# Patient Record
Sex: Male | Born: 1955
Health system: Southern US, Community
[De-identification: ages and names within clinical notes are randomized; demographics above are authoritative.]

## PROBLEM LIST (undated history)

## (undated) DIAGNOSIS — A048 Other specified bacterial intestinal infections: Secondary | ICD-10-CM

## (undated) DIAGNOSIS — J309 Allergic rhinitis, unspecified: Secondary | ICD-10-CM

## (undated) DIAGNOSIS — E785 Hyperlipidemia, unspecified: Secondary | ICD-10-CM

## (undated) DIAGNOSIS — I341 Nonrheumatic mitral (valve) prolapse: Secondary | ICD-10-CM

## (undated) DIAGNOSIS — M7710 Lateral epicondylitis, unspecified elbow: Secondary | ICD-10-CM

## (undated) DIAGNOSIS — K219 Gastro-esophageal reflux disease without esophagitis: Secondary | ICD-10-CM

## (undated) DIAGNOSIS — R918 Other nonspecific abnormal finding of lung field: Secondary | ICD-10-CM

## (undated) DIAGNOSIS — G43909 Migraine, unspecified, not intractable, without status migrainosus: Secondary | ICD-10-CM

## (undated) DIAGNOSIS — I2510 Atherosclerotic heart disease of native coronary artery without angina pectoris: Secondary | ICD-10-CM

## (undated) DIAGNOSIS — Z8619 Personal history of other infectious and parasitic diseases: Secondary | ICD-10-CM

## (undated) DIAGNOSIS — M1990 Unspecified osteoarthritis, unspecified site: Secondary | ICD-10-CM

## (undated) DIAGNOSIS — S5400XA Injury of ulnar nerve at forearm level, unspecified arm, initial encounter: Secondary | ICD-10-CM

## (undated) DIAGNOSIS — K760 Fatty (change of) liver, not elsewhere classified: Secondary | ICD-10-CM

## (undated) DIAGNOSIS — N200 Calculus of kidney: Secondary | ICD-10-CM

## (undated) DIAGNOSIS — Z972 Presence of dental prosthetic device (complete) (partial): Secondary | ICD-10-CM

## (undated) DIAGNOSIS — C4491 Basal cell carcinoma of skin, unspecified: Secondary | ICD-10-CM

## (undated) HISTORY — DX: Other specified bacterial intestinal infections: A04.8

## (undated) HISTORY — DX: Hyperlipidemia, unspecified: E78.5

## (undated) HISTORY — DX: Other nonspecific abnormal finding of lung field: R91.8

## (undated) HISTORY — DX: Calculus of kidney: N20.0

## (undated) HISTORY — DX: Allergic rhinitis, unspecified: J30.9

## (undated) HISTORY — DX: Nonrheumatic mitral (valve) prolapse: I34.1

## (undated) HISTORY — DX: Basal cell carcinoma of skin, unspecified: C44.91

## (undated) HISTORY — DX: Gastro-esophageal reflux disease without esophagitis: K21.9

## (undated) HISTORY — DX: Fatty (change of) liver, not elsewhere classified: K76.0

## (undated) HISTORY — DX: Migraine, unspecified, not intractable, without status migrainosus: G43.909

## (undated) HISTORY — DX: Atherosclerotic heart disease of native coronary artery without angina pectoris: I25.10

## (undated) HISTORY — DX: Personal history of other infectious and parasitic diseases: Z86.19

---

## 1976-02-20 HISTORY — PX: DENTAL SURGERY: SHX609

## 1997-02-19 HISTORY — PX: ESOPHAGOGASTRODUODENOSCOPY: SHX1529

## 2000-09-17 ENCOUNTER — Encounter: Admission: RE | Admit: 2000-09-17 | Discharge: 2000-09-17 | Payer: Self-pay | Admitting: *Deleted

## 2000-09-17 ENCOUNTER — Encounter: Payer: Self-pay | Admitting: Allergy and Immunology

## 2003-02-20 HISTORY — PX: NASAL SINUS SURGERY: SHX719

## 2003-06-28 ENCOUNTER — Other Ambulatory Visit: Payer: Self-pay

## 2003-11-30 ENCOUNTER — Ambulatory Visit: Payer: Self-pay | Admitting: Otolaryngology

## 2005-08-29 ENCOUNTER — Encounter: Payer: Self-pay | Admitting: General Practice

## 2005-09-19 ENCOUNTER — Encounter: Payer: Self-pay | Admitting: General Practice

## 2005-10-20 ENCOUNTER — Encounter: Payer: Self-pay | Admitting: General Practice

## 2006-02-19 HISTORY — PX: COLONOSCOPY: SHX174

## 2006-02-19 LAB — HM COLONOSCOPY

## 2006-03-29 ENCOUNTER — Ambulatory Visit: Payer: Self-pay | Admitting: Gastroenterology

## 2006-07-05 ENCOUNTER — Emergency Department: Payer: Self-pay | Admitting: Emergency Medicine

## 2006-07-05 ENCOUNTER — Other Ambulatory Visit: Payer: Self-pay

## 2010-08-01 ENCOUNTER — Encounter: Payer: Self-pay | Admitting: General Practice

## 2010-08-20 ENCOUNTER — Encounter: Payer: Self-pay | Admitting: General Practice

## 2011-06-28 ENCOUNTER — Encounter: Payer: Self-pay | Admitting: General Practice

## 2011-07-21 ENCOUNTER — Encounter: Payer: Self-pay | Admitting: General Practice

## 2012-04-21 LAB — LIPID PANEL
CHOLESTEROL: 158 mg/dL (ref 0–200)
HDL: 33 mg/dL — AB (ref 35–70)
LDL CALC: 95.3
Triglycerides: 147

## 2012-04-21 LAB — COMPREHENSIVE METABOLIC PANEL
ALT: 31 U/L (ref 10–40)
AST: 22 U/L
Alkaline Phosphatase: 43 U/L
Creat: 1.3
Potassium: 4.6 mmol/L
SODIUM: 141 mmol/L (ref 137–147)

## 2012-04-21 LAB — PSA: PSA: 1.26

## 2012-04-21 LAB — CBC
HEMOGLOBIN: 15.3 g/dL
WBC: 4.9
platelet count: 218

## 2013-04-06 ENCOUNTER — Telehealth: Payer: Self-pay | Admitting: Family Medicine

## 2013-04-06 NOTE — Telephone Encounter (Signed)
Pt is trying to est w/you as his PCP.  Your first new patient apptmt is 06/22/2013, however, pt will need to be seen prior to the end of March, 2015 due to insurance purposes.  Can you accommodate him a new pt apptmt prior to the end of March? Thank you.

## 2013-04-07 NOTE — Telephone Encounter (Signed)
May place 5:30pm on march 12th if ok with patient.

## 2013-04-08 NOTE — Telephone Encounter (Signed)
Scheduled pt 04/30/2013 @ 5:30 p.m.

## 2013-04-30 ENCOUNTER — Encounter: Payer: Self-pay | Admitting: Family Medicine

## 2013-04-30 ENCOUNTER — Ambulatory Visit (INDEPENDENT_AMBULATORY_CARE_PROVIDER_SITE_OTHER): Payer: BC Managed Care – PPO | Admitting: Family Medicine

## 2013-04-30 VITALS — BP 126/78 | HR 68 | Temp 98.1°F | Wt 211.8 lb

## 2013-04-30 DIAGNOSIS — Z0000 Encounter for general adult medical examination without abnormal findings: Secondary | ICD-10-CM | POA: Insufficient documentation

## 2013-04-30 DIAGNOSIS — Z125 Encounter for screening for malignant neoplasm of prostate: Secondary | ICD-10-CM

## 2013-04-30 DIAGNOSIS — K219 Gastro-esophageal reflux disease without esophagitis: Secondary | ICD-10-CM | POA: Insufficient documentation

## 2013-04-30 DIAGNOSIS — E785 Hyperlipidemia, unspecified: Secondary | ICD-10-CM

## 2013-04-30 DIAGNOSIS — G43909 Migraine, unspecified, not intractable, without status migrainosus: Secondary | ICD-10-CM

## 2013-04-30 DIAGNOSIS — J309 Allergic rhinitis, unspecified: Secondary | ICD-10-CM | POA: Insufficient documentation

## 2013-04-30 MED ORDER — ATORVASTATIN CALCIUM 10 MG PO TABS
10.0000 mg | ORAL_TABLET | Freq: Every day | ORAL | Status: DC
Start: 2013-04-30 — End: 2014-04-29

## 2013-04-30 MED ORDER — FENOFIBRATE MICRONIZED 134 MG PO CAPS: 134.0000 mg | ORAL_CAPSULE | Freq: Every day | ORAL | Status: DC

## 2013-04-30 MED ORDER — OMEPRAZOLE 20 MG PO CPDR: 20.0000 mg | DELAYED_RELEASE_CAPSULE | Freq: Every day | ORAL | Status: DC

## 2013-04-30 MED ORDER — OMEGA-3-ACID ETHYL ESTERS 1 G PO CAPS: 2.0000 | ORAL_CAPSULE | Freq: Every day | ORAL | Status: DC

## 2013-04-30 NOTE — Assessment & Plan Note (Signed)
Continue PPI ?

## 2013-04-30 NOTE — Patient Instructions (Signed)
Good to meet you today  Physical today. Blood work today Call us with questions.  Return in 1 year or sooner as needed

## 2013-04-30 NOTE — Assessment & Plan Note (Signed)
Improved with sinus surgery

## 2013-04-30 NOTE — Assessment & Plan Note (Signed)
Chronic, check FLP today.

## 2013-04-30 NOTE — Assessment & Plan Note (Addendum)
Preventative protocols reviewed and updated unless pt declined. Discussed healthy diet and lifestyle. Desires PSA/DRE

## 2013-04-30 NOTE — Progress Notes (Signed)
BP 126/78  Pulse 68  Temp(Src) 98.1 F (36.7 C) (Oral)  Wt 211 lb 12.8 oz (96.072 kg)   CC: new pt to establish  Subjective:    Patient ID: Ethan Austin, male    DOB: 1955-11-29, 58 y.o.   MRN: [id number]  HPI: Ethan Austin is a 58 y.o. male presenting on 04/30/2013 with Establish Care   Prior saw Dr. Verneda Skill, bad experience with refilling meds.  HLD - takes lovaza and then lipitor [place] (alternating days).  No myalgias.  Last FLP was 1 year ago.  Currently receiving treatment for sinusitis by Dr. Nadeen Landau - on levofloxacin.  Tends to get 1 per year.  S/p sinus surgery.  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  No sunburns.  + h/o BCC  Preventative: 02/2011  Colonoscopy 2008 WNL per patient, rec rpt 10 years. Flu - at work Tdap - 2014  Relevant past medical, surgical, family and social history reviewed and updated as indicated.  Allergies and medications reviewed and updated. No current outpatient prescriptions on file prior to visit.   No current facility-administered medications on file prior to visit.    Review of Systems  Constitutional: Negative for fever, chills, activity change, appetite change, fatigue and unexpected weight change.  HENT: Negative for hearing loss.   Eyes: Negative for visual disturbance.  Respiratory: Negative for cough, chest tightness, shortness of breath and wheezing.   Cardiovascular: Negative for chest pain, palpitations and leg swelling.  Gastrointestinal: Negative for nausea, vomiting, abdominal pain, diarrhea, constipation, blood in stool and abdominal distention.  Genitourinary: Negative for hematuria and difficulty urinating.  Musculoskeletal: Negative for arthralgias, myalgias and neck  pain.  Skin: Negative for rash.  Neurological: Negative for dizziness, seizures, syncope and headaches.  Hematological: Negative for adenopathy. Does not bruise/bleed easily.  Psychiatric/Behavioral: Negative for dysphoric mood. The patient is not nervous/anxious.    Per HPI unless specifically indicated above    Objective:    BP 126/78  Pulse 68  Temp(Src) 98.1 F (36.7 C) (Oral)  Wt 211 lb 12.8 oz (96.072 kg)  Physical Exam  Nursing note and vitals reviewed. Constitutional: He is oriented to person, place, and time. He appears well-developed and well-nourished. No distress.  HENT:  Head: Normocephalic and atraumatic.  Right Ear: Hearing, tympanic membrane, external ear and ear canal normal.  Left Ear: Hearing, tympanic membrane, external ear and ear canal normal.  Nose: Nose normal.  Mouth/Throat: Uvula is midline, oropharynx is clear and moist and mucous membranes are normal. No oropharyngeal exudate, posterior oropharyngeal edema or posterior oropharyngeal erythema.  Eyes: Conjunctivae and EOM are normal. Pupils are equal, round, and reactive to light. No scleral icterus.  Neck: Normal range of motion. Neck supple.  Cardiovascular: Normal rate, regular rhythm and intact distal pulses.   Murmur (3/6 SEM best at apex) heard. Pulses:      Radial pulses are 2+ on the right side, and 2+ on the left side.  Pulmonary/Chest: Effort normal and breath sounds normal. No respiratory distress. He has no wheezes. He has no rales.  Abdominal: Soft. Bowel sounds are normal. He exhibits no distension and no mass. There is no tenderness. There is no rebound and no guarding.  Genitourinary: Rectum normal and prostate normal. Rectal exam shows no  external hemorrhoid, no internal hemorrhoid, no fissure, no mass, no tenderness and anal tone normal. Prostate is not enlarged (10gm) and not tender.  Musculoskeletal: Normal range of motion. He exhibits no edema.  Lymphadenopathy:    He has no cervical  adenopathy.  Neurological: He is alert and oriented to person, place, and time.  CN grossly intact, station and gait intact  Skin: Skin is warm and dry. No rash noted.  Psychiatric: He has a normal mood and affect. His behavior is normal. Judgment and thought content normal.   No results found for this or any previous visit.    Assessment & Plan:   Problem List Items Addressed This Visit   Allergic rhinitis   GERD (gastroesophageal reflux disease)     Continue PPI.    Relevant Medications      omeprazole (PRILOSEC) 20 MG capsule   Health care maintenance - Primary     Preventative protocols reviewed and updated unless pt declined. Discussed healthy diet and lifestyle. Desires PSA/DRE    HLD (hyperlipidemia)     Chronic, check FLP today.    Relevant Medications      fenofibrate micronized (LOFIBRA) 134 MG capsule      atorvastatin (LIPITOR) 10 MG tablet      aspirin 81 MG tablet      omega-3 acid ethyl esters (LOVAZA) 1 G capsule   Other Relevant Orders      Lipid panel      Comprehensive metabolic panel      TSH   RESOLVED: Migraine     Improved with sinus surgery     Other Visit Diagnoses   Special screening for malignant neoplasm of prostate        Relevant Orders       PSA        Follow up plan: No Follow-up on file.

## 2013-05-01 LAB — COMPREHENSIVE METABOLIC PANEL
ALT: 31 U/L (ref 0–53)
AST: 24 U/L (ref 0–37)
Albumin: 4.6 g/dL (ref 3.5–5.2)
Alkaline Phosphatase: 47 U/L (ref 39–117)
BUN: 21 mg/dL (ref 6–23)
CO2: 27 mEq/L (ref 19–32)
Calcium: 9.8 mg/dL (ref 8.4–10.5)
Chloride: 105 mEq/L (ref 96–112)
Creatinine, Ser: 1.3 mg/dL (ref 0.4–1.5)
GFR: 61.15 mL/min (ref >60.00)
Glucose, Bld: 91 mg/dL (ref 70–99)
Potassium: 4.1 mEq/L (ref 3.5–5.1)
Sodium: 140 mEq/L (ref 135–145)
Total Bilirubin: 1.1 mg/dL (ref 0.3–1.2)
Total Protein: 7.1 g/dL (ref 6.0–8.3)

## 2013-05-01 LAB — PSA: PSA: 0.91 ng/mL (ref 0.10–4.00)

## 2013-05-01 LAB — LIPID PANEL
Cholesterol: 185 mg/dL (ref 0–200)
HDL: 35.5 mg/dL — ABNORMAL LOW (ref >39.00)
LDL Cholesterol: 117 mg/dL — ABNORMAL HIGH (ref 0–99)
Total CHOL/HDL Ratio: 5
Triglycerides: 161 mg/dL — ABNORMAL HIGH (ref 0.0–149.0)
VLDL: 32.2 mg/dL (ref 0.0–40.0)

## 2013-05-01 LAB — TSH: TSH: 0.57 u[IU]/mL (ref 0.35–5.50)

## 2013-05-04 ENCOUNTER — Telehealth: Payer: Self-pay

## 2013-05-04 DIAGNOSIS — Z0000 Encounter for general adult medical examination without abnormal findings: Secondary | ICD-10-CM

## 2013-05-04 NOTE — Telephone Encounter (Signed)
Pt left v/m; pt recently had lab test and office visit;  pt needs nicotine lab testing for tobacco substance use that is required by pts employer and also has a form to be completed after receives the nicotine results.pt request cb.

## 2013-05-04 NOTE — Telephone Encounter (Signed)
Patient notified and lab appt scheduled.  

## 2013-05-04 NOTE — Telephone Encounter (Signed)
Test ordered.  plz have him come in for rpt blood test.  May drop off form at that time.  Thanks.

## 2013-05-06 ENCOUNTER — Telehealth: Payer: Self-pay | Admitting: Family Medicine

## 2013-05-06 ENCOUNTER — Encounter: Payer: Self-pay | Admitting: Family Medicine

## 2013-05-06 ENCOUNTER — Other Ambulatory Visit (INDEPENDENT_AMBULATORY_CARE_PROVIDER_SITE_OTHER): Payer: BC Managed Care – PPO

## 2013-05-06 DIAGNOSIS — Z0000 Encounter for general adult medical examination without abnormal findings: Secondary | ICD-10-CM

## 2013-05-06 NOTE — Telephone Encounter (Signed)
Pt dropped off work wellness form to be filled out. Form given to [employer]. Thank you

## 2013-05-07 NOTE — Telephone Encounter (Signed)
Form in your IN box for completion. Patient would like to come pick up a copy when completed. Please return to me.

## 2013-05-07 NOTE — Telephone Encounter (Signed)
Signed and placed in [name]'s box. Awaiting nicotine test. plz remember to do heights at new pt appts and at annual exams.

## 2013-05-08 LAB — NICOTINE/COTININE METABOLITES

## 2013-05-25 NOTE — Telephone Encounter (Addendum)
Spoke to patient and was advised that he has already picked the completed form up. Someone had called the patient, but there was not any documentation. See note about heights.

## 2013-06-08 ENCOUNTER — Encounter: Payer: Self-pay | Admitting: *Deleted

## 2014-01-04 ENCOUNTER — Encounter: Payer: Self-pay | Admitting: Family Medicine

## 2014-01-04 ENCOUNTER — Ambulatory Visit (INDEPENDENT_AMBULATORY_CARE_PROVIDER_SITE_OTHER): Payer: BC Managed Care – PPO | Admitting: Family Medicine

## 2014-01-04 VITALS — BP 128/78 | HR 72 | Temp 98.2°F | Wt 215.5 lb

## 2014-01-04 DIAGNOSIS — M5432 Sciatica, left side: Secondary | ICD-10-CM

## 2014-01-04 DIAGNOSIS — M5430 Sciatica, unspecified side: Secondary | ICD-10-CM | POA: Insufficient documentation

## 2014-01-04 DIAGNOSIS — M7062 Trochanteric bursitis, left hip: Secondary | ICD-10-CM

## 2014-01-04 DIAGNOSIS — M7060 Trochanteric bursitis, unspecified hip: Secondary | ICD-10-CM | POA: Insufficient documentation

## 2014-01-04 MED ORDER — NAPROXEN 500 MG PO TABS: ORAL_TABLET | ORAL | Status: DC

## 2014-01-04 MED ORDER — TRAMADOL HCL 50 MG PO TABS: 50.0000 mg | ORAL_TABLET | Freq: Two times a day (BID) | ORAL | Status: DC | PRN

## 2014-01-04 NOTE — Progress Notes (Signed)
   BP 128/78 mmHg  Pulse 72  Temp(Src) 98.2 F (36.8 C) (Oral)  Wt 215 lb 8 oz (97.75 kg)   CC: hip pain  Subjective:    Patient ID: Ethan Austin, male    DOB: November 27, 1955, 58 y.o.   MRN: [id number]  HPI: Ethan Austin is a 58 y.o. male presenting on 01/04/2014 for Hip Pain   5d h/o L hip pain. Denies inciting trauma/injury. Did some cleaning at church last week (mopping floor). Thursday started with nagging L buttock pain that has progressively worsened over weekend. Sitting and tying shoe worsens pain. No radiation of pain, stays at hip and buttock. Ibuprofen 800mg  and tylenol haven't really helped.   No h/o hip injury or issues in the past.  Relevant past medical, surgical, family and social history reviewed and updated as indicated.  Allergies and medications reviewed and updated. Current Outpatient Prescriptions on File Prior to Visit  Medication Sig  . aspirin 81 MG tablet Take 81 mg by mouth daily.  Marland Kitchen atorvastatin (LIPITOR) 10 MG tablet Take 1 tablet (10 mg total) by mouth daily at 6 PM. (Patient taking differently: Take 10 mg by mouth every other day. Alternates with fenofibrate)  . fenofibrate micronized (LOFIBRA) 134 MG capsule Take 1 capsule (134 mg total) by mouth daily before breakfast. (Patient taking differently: Take 134 mg by mouth every other day. Alternates with atorvastatin)  . omega-3 acid ethyl esters (LOVAZA) 1 G capsule Take 2 capsules (2 g total) by mouth daily.  Marland Kitchen omeprazole (PRILOSEC) 20 MG capsule Take 1 capsule (20 mg total) by mouth daily.   No current facility-administered medications on file prior to visit.    Review of Systems Per HPI unless specifically indicated above    Objective:    BP 128/78 mmHg  Pulse 72  Temp(Src) 98.2 F (36.8 C) (Oral)  Wt 215 lb 8 oz (97.75 kg)  Physical Exam  Constitutional: He is oriented to person, place, and time. He appears well-developed and well-nourished. No distress.  Prefers to stand    Musculoskeletal: He exhibits no edema.  No pain midline spine No paraspinous mm tenderness Neg SLR bilaterally. No pain with int/ext rotation at hip. Tender L hip to FABER. No pain at SIJ Tender at L GTB and superior to sciatic notch  Neurological: He is alert and oriented to person, place, and time. He has normal strength. No sensory deficit.  5/5 strength BLE  Nursing note and vitals reviewed.      Assessment & Plan:   Problem List Items Addressed This Visit    Trochanteric bursitis of left hip - Primary    Exam consistent with L GTBursitis. Treat with naprosyn, tramadol for breakthrough pain, exercises from SM pt advisor. Continue heating pad. Offered PT if not improving. Consider steroid injection as well.    Sciatica of left side    Also anticipate component of L sciatica  Treat with naprosyn, tramadol for breakthrough pain, exercises from SM pt advisor. Continue heating pad. Offered PT if not improving.         Follow up plan: Return if symptoms worsen or fail to improve.

## 2014-01-04 NOTE — Assessment & Plan Note (Signed)
Exam consistent with L GTBursitis. Treat with naprosyn, tramadol for breakthrough pain, exercises from SM pt advisor. Continue heating pad. Offered PT if not improving. Consider steroid injection as well.

## 2014-01-04 NOTE — Assessment & Plan Note (Signed)
Also anticipate component of L sciatica  Treat with naprosyn, tramadol for breakthrough pain, exercises from SM pt advisor. Continue heating pad. Offered PT if not improving.

## 2014-01-04 NOTE — Progress Notes (Signed)
Pre visit review using our clinic review tool, if applicable. No additional management support is needed unless otherwise documented below in the visit note. 

## 2014-01-04 NOTE — Patient Instructions (Signed)
I think you have left hip bursitis as well as sciatica. Treat with naprosyn twice daily with food for 5 days then as needed, may use tramadol for breakthrough pain. Do exercises provided today. Continue heating pad. If GERD flaring up on naprosyn, may double up on omeprazole for short course. If not better with above, let me know for PT referral.

## 2014-01-20 ENCOUNTER — Encounter: Payer: Self-pay | Admitting: Family Medicine

## 2014-01-20 NOTE — Telephone Encounter (Signed)
See above. plz call pt to schedule appt with me or other available provider if pt desires.

## 2014-01-20 NOTE — Telephone Encounter (Signed)
Message left advising patient to call and schedule appt. Advised none available with you tomorrow, but he could see another provider if he wanted or he could wait until Friday.

## 2014-01-20 NOTE — Telephone Encounter (Signed)
May schedule Friday at 4:30pm.

## 2014-01-22 ENCOUNTER — Encounter: Payer: Self-pay | Admitting: Family Medicine

## 2014-01-22 ENCOUNTER — Ambulatory Visit (INDEPENDENT_AMBULATORY_CARE_PROVIDER_SITE_OTHER): Payer: BC Managed Care – PPO | Admitting: Family Medicine

## 2014-01-22 VITALS — BP 132/70 | HR 68 | Temp 98.2°F | Wt 213.0 lb

## 2014-01-22 DIAGNOSIS — R0982 Postnasal drip: Secondary | ICD-10-CM | POA: Insufficient documentation

## 2014-01-22 MED ORDER — FLUTICASONE PROPIONATE 50 MCG/ACT NA SUSP: 2.0000 | Freq: Every day | NASAL | Status: DC

## 2014-01-22 NOTE — Progress Notes (Signed)
Pre visit review using our clinic review tool, if applicable. No additional management support is needed unless otherwise documented below in the visit note. 

## 2014-01-22 NOTE — Assessment & Plan Note (Signed)
No evidence of infectious cause today. Treat with flonase, start antihistamine. Update if not improving as expected or any new sxs like fever or worsening cough.

## 2014-01-22 NOTE — Patient Instructions (Signed)
I think this is allergy related drainage. I'd recommend restarting claritin or zyrtec daily, and restarting flonase nasal steroid. Watch for fever >101 or worsening cough.

## 2014-01-22 NOTE — Progress Notes (Signed)
BP 132/70 mmHg  Pulse 68  Temp(Src) 98.2 F (36.8 C) (Oral)  Wt 213 lb (96.616 kg)   CC: throat drainage/ST  Subjective:    Patient ID: Ethan Austin, male    DOB: November 26, 1955, 58 y.o.   MRN: [id number]  HPI: LIDO MASKE is a 58 y.o. male presenting on 01/22/2014 for Sore Throat   Significant amt throat drainage over last 5 days along with sneezing and cough from drainage. Mild nausea from drainage.   No pressure headache, sinus congestion, fevers, ear pain.  Using nasal saline flush to treat, neti pot. No sick contacts at home. Known allergic rhinitis  Drinking water. Coffee seems to clear sxs.  Relevant past medical, surgical, family and social history reviewed and updated as indicated. Interim medical history since our last visit reviewed. Allergies and medications reviewed and updated.  Current Outpatient Prescriptions on File Prior to Visit  Medication Sig  . aspirin 81 MG tablet Take 81 mg by mouth daily.  Marland Kitchen atorvastatin (LIPITOR) 10 MG tablet Take 1 tablet (10 mg total) by mouth daily at 6 PM. (Patient taking differently: Take 10 mg by mouth every other day. Alternates with fenofibrate)  . fenofibrate micronized (LOFIBRA) 134 MG capsule Take 1 capsule (134 mg total) by mouth daily before breakfast. (Patient taking differently: Take 134 mg by mouth every other day. Alternates with atorvastatin)  . naproxen (NAPROSYN) 500 MG tablet Take one po bid x 5 days then prn pain, take with food  . omega-3 acid ethyl esters (LOVAZA) 1 G capsule Take 2 capsules (2 g total) by mouth daily.  Marland Kitchen omeprazole (PRILOSEC) 20 MG capsule Take 1 capsule (20 mg total) by mouth daily.  . traMADol (ULTRAM) 50 MG tablet Take 1 tablet (50 mg total) by mouth 2 (two) times daily as needed.   No current facility-administered medications on file prior to visit.    Review of Systems Per HPI unless specifically indicated above     Objective:    BP 132/70 mmHg  Pulse 68  Temp(Src) 98.2 F  (36.8 C) (Oral)  Wt 213 lb (96.616 kg)  Wt Readings from Last 3 Encounters:  01/22/14 213 lb (96.616 kg)  01/04/14 215 lb 8 oz (97.75 kg)  04/30/13 211 lb 12.8 oz (96.072 kg)    Physical Exam  Constitutional: He appears well-developed and well-nourished. No distress.  HENT:  Head: Normocephalic and atraumatic.  Right Ear: Hearing, tympanic membrane, external ear and ear canal normal.  Left Ear: Hearing, tympanic membrane, external ear and ear canal normal.  Nose: Mucosal edema (nasal mucosal congestion) present. No rhinorrhea. Right sinus exhibits no maxillary sinus tenderness and no frontal sinus tenderness. Left sinus exhibits no maxillary sinus tenderness and no frontal sinus tenderness.  Mouth/Throat: Uvula is midline, oropharynx is clear and moist and mucous membranes are normal. No oropharyngeal exudate, posterior oropharyngeal edema, posterior oropharyngeal erythema or tonsillar abscesses.  No significant drainage noted today.  Eyes: Conjunctivae and EOM are normal. Pupils are equal, round, and reactive to light. No scleral icterus.  Neck: Normal range of motion. Neck supple.  Pulmonary/Chest: Effort normal and breath sounds normal. No respiratory distress. He has no wheezes. He has no rales.  Lymphadenopathy:    He has no cervical adenopathy.  Skin: Skin is warm and dry. No rash noted.  Nursing note and vitals reviewed.  Results for orders placed or performed in visit on 06/08/13  CBC  Result Value Ref Range   WBC 4.9  HGB 15.3 g/dL   platelet count 218   PSA  Result Value Ref Range   PSA 1.26   Comprehensive metabolic panel  Result Value Ref Range   Alkaline Phosphatase 43 U/L   Creat 1.3    Potassium 4.6 mmol/L   AST 22 U/L   ALT 31 10 - 40 U/L   Sodium 141 137 - 147 mmol/L  Lipid panel  Result Value Ref Range   Cholesterol 158 0 - 200 mg/dL   Triglycerides 147    HDL 33 (A) 35 - 70 mg/dL   LDL (calc) 95.3       Assessment & Plan:   Problem List Items  Addressed This Visit    Post-nasal drip - Primary    No evidence of infectious cause today. Treat with flonase, start antihistamine. Update if not improving as expected or any new sxs like fever or worsening cough.        Follow up plan: Return if symptoms worsen or fail to improve.

## 2014-04-08 ENCOUNTER — Other Ambulatory Visit: Payer: Self-pay | Admitting: Family Medicine

## 2014-04-09 MED ORDER — TRAMADOL HCL 50 MG PO TABS: 50.0000 mg | ORAL_TABLET | Freq: Two times a day (BID) | ORAL | Status: DC | PRN

## 2014-04-09 MED ORDER — NAPROXEN 500 MG PO TABS: ORAL_TABLET | ORAL | Status: DC

## 2014-04-09 NOTE — Telephone Encounter (Signed)
Rx called in as directed.   

## 2014-04-09 NOTE — Telephone Encounter (Signed)
plz phone in. 

## 2014-04-12 ENCOUNTER — Ambulatory Visit (INDEPENDENT_AMBULATORY_CARE_PROVIDER_SITE_OTHER): Payer: BLUE CROSS/BLUE SHIELD | Admitting: Internal Medicine

## 2014-04-12 ENCOUNTER — Encounter: Payer: Self-pay | Admitting: Internal Medicine

## 2014-04-12 VITALS — BP 122/80 | HR 83 | Temp 99.7°F | Wt 215.0 lb

## 2014-04-12 DIAGNOSIS — J0100 Acute maxillary sinusitis, unspecified: Secondary | ICD-10-CM

## 2014-04-12 MED ORDER — CEFUROXIME AXETIL 500 MG PO TABS: 500.0000 mg | ORAL_TABLET | Freq: Two times a day (BID) | ORAL | Status: DC

## 2014-04-12 NOTE — Progress Notes (Signed)
Pre visit review using our clinic review tool, if applicable. No additional management support is needed unless otherwise documented below in the visit note. 

## 2014-04-12 NOTE — Progress Notes (Signed)
HPI  Pt presents to the clinic today with c/o headache, facial pain and pressure, post nasal drip and sore throat. He reports this started 2 weeks ago. He is not blowing any mucous out of his nose. He has run fevers up to 100. He has a non productive cough. He has had associated chills and body aches. He does have a history of allergies and sinus infections. He reports he has had a sinusoplasty by ENT. He has tried Mucinex and saline spray without much relief. He also takes zyrtec and flonase daily. He has not had sick contacts that he is aware of. He does not smoke.  Review of Systems    Past Medical History  Diagnosis Date  . Allergic rhinitis   . History of chicken pox   . GERD (gastroesophageal reflux disease)   . Migraine <2005    resolved after sinus surgery, now occasional TTH  . H. pylori infection   . HLD (hyperlipidemia)   . MVP (mitral valve prolapse)     history  . BCC (basal cell carcinoma of skin)     on face and nose    Family History  Problem Relation Age of Onset  . Alzheimer's disease Mother   . Cancer Sister     breast  . Diabetes Maternal Grandfather   . Diabetes Maternal Grandmother   . Stroke Mother   . CAD Father     mild MI  . Hypertension Mother     History   Social History  . Marital Status: Married    Spouse Name: N/A  . Number of Children: N/A  . Years of Education: N/A   Occupational History  . Not on file.   Social History Main Topics  . Smoking status: Never Smoker   . Smokeless tobacco: Never Used  . Alcohol Use: No  . Drug Use: No  . Sexual Activity: Not on file   Other Topics Concern  . 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 Sulfur Nausea Only      Constitutional: Positive headache, fatigue and fever. Denies abrupt weight changes.  HEENT:  Positive facial pain, nasal congestion and sore throat. Denies eye redness, ear pain, ringing in the ears, wax buildup, runny nose or bloody nose. Respiratory: Positive cough. Denies difficulty breathing or shortness of breath.  Cardiovascular: Denies chest pain, chest tightness, palpitations or swelling in the hands or feet.   No other specific complaints in a complete review of systems (except as listed in HPI above).  Objective:  BP 122/80 mmHg  Pulse 83  Temp(Src) 99.7 F (37.6 C) (Oral)  Wt 215 lb (97.523 kg)  SpO2 98%   General: Appears his stated age, ill appearing in NAD. HEENT: Head: normal shape and size, maxillary sinus tenderness noted; Eyes: sclera white, no icterus, conjunctiva pink; Ears: Tm's gray and intact, normal light reflex, + serous effusion bilaterally; Nose: mucosa pink and moist, septum midline; Throat/Mouth: + PND. Teeth present, mucosa pink and moist, no exudate noted, no lesions or ulcerations noted.  Neck: No adenopathy noted. Cardiovascular: Normal rate and rhythm. S1,S2 noted.  No murmur, rubs or gallops noted.  Pulmonary/Chest: Normal effort and  positive vesicular breath sounds. No respiratory distress. No wheezes, rales or ronchi noted.      Assessment & Plan:   Acute maxillary sinusitis  Can use a Neti Pot which can be purchased from your local drug store. Continue zyrtec and Flonase as directed Ceftin BID for 10 days Tylenol or Ibuprofen for fever  RTC as needed or if symptoms persist.

## 2014-04-12 NOTE — Patient Instructions (Signed)

## 2014-04-15 ENCOUNTER — Encounter: Payer: Self-pay | Admitting: Internal Medicine

## 2014-04-29 ENCOUNTER — Ambulatory Visit (INDEPENDENT_AMBULATORY_CARE_PROVIDER_SITE_OTHER): Payer: BLUE CROSS/BLUE SHIELD | Admitting: Family Medicine

## 2014-04-29 ENCOUNTER — Encounter: Payer: Self-pay | Admitting: Family Medicine

## 2014-04-29 VITALS — BP 106/62 | HR 62 | Temp 97.9°F | Ht 75.0 in | Wt 208.5 lb

## 2014-04-29 DIAGNOSIS — Z125 Encounter for screening for malignant neoplasm of prostate: Secondary | ICD-10-CM

## 2014-04-29 DIAGNOSIS — E785 Hyperlipidemia, unspecified: Secondary | ICD-10-CM

## 2014-04-29 DIAGNOSIS — Z0000 Encounter for general adult medical examination without abnormal findings: Secondary | ICD-10-CM | POA: Diagnosis not present

## 2014-04-29 DIAGNOSIS — J329 Chronic sinusitis, unspecified: Secondary | ICD-10-CM | POA: Insufficient documentation

## 2014-04-29 MED ORDER — OMEGA-3-ACID ETHYL ESTERS 1 G PO CAPS: 4.0000 | ORAL_CAPSULE | Freq: Every day | ORAL | Status: DC

## 2014-04-29 MED ORDER — FENOFIBRATE MICRONIZED 134 MG PO CAPS: 134.0000 mg | ORAL_CAPSULE | Freq: Every day | ORAL | Status: DC

## 2014-04-29 MED ORDER — OMEPRAZOLE 20 MG PO CPDR: 20.0000 mg | DELAYED_RELEASE_CAPSULE | Freq: Every day | ORAL | Status: DC

## 2014-04-29 MED ORDER — ATORVASTATIN CALCIUM 10 MG PO TABS: 10.0000 mg | ORAL_TABLET | Freq: Every day | ORAL | Status: DC

## 2014-04-29 NOTE — Assessment & Plan Note (Signed)
meds refilled today. Check FLP today.

## 2014-04-29 NOTE — Progress Notes (Signed)
Pre visit review using our clinic review tool, if applicable. No additional management support is needed unless otherwise documented below in the visit note. 

## 2014-04-29 NOTE — Assessment & Plan Note (Addendum)
Preventative protocols reviewed and updated unless pt declined. Discussed healthy diet and lifestyle.  Needs cotinine levels checked today.

## 2014-04-29 NOTE — Patient Instructions (Addendum)
Blood work today. We will fill out form for work and fax in when labs return. Sign release form up front for colonoscopy report. Return as needed or in 1 year for next physical.

## 2014-04-29 NOTE — Assessment & Plan Note (Addendum)
Followed by ENT Dr Carlis Abbott.

## 2014-04-29 NOTE — Progress Notes (Signed)
BP 106/62 mmHg  Pulse 62  Temp(Src) 97.9 F (36.6 C) (Oral)  Ht 6\' 3"  (1.905 m)  Wt 208 lb 8 oz (94.575 kg)  BMI 26.06 kg/m2  SpO2 97%   CC: CPE  Subjective:    Patient ID: Ethan Austin, male    DOB: [birth year], 59 y.o.   MRN: [id number]  HPI: Ethan Austin is a 59 y.o. male presenting on 04/29/2014 for Annual Exam   Recurrent sinus infection last month s/p 3 rounds of abx. Had CT scan last week.   HLD - alternates fenofibrate with lipitor every other day. Takes 2gm lovaza daily.  Off lipitor for the past week 2/2 using fluconazole for thrush after abx. Throat feeling better but still looks bad.   Seat belt use discussed Sunscreen use discussed, hat use. No sunburns. + h/o BCC.   Preventative: Fasting since noon. Colonoscopy 2008 WNL per patient, rec rpt 10 years.  Prostate cancer screening - discussed, would like screening.  Flu - 11/2013 Tdap - 2014  Caffeine: 2 cups coffee in am, 1 tea at night Lives with wife and 2 children Occupation: Armed forces logistics/support/administrative officer at [employer]: some college Activity: [employer] with wife Diet: good water, fruits/vegetables daily  Relevant past medical, surgical, family and social history reviewed and updated as indicated. Interim medical history since our last visit reviewed. Allergies and medications reviewed and updated. Current Outpatient Prescriptions on File Prior to Visit  Medication Sig  . aspirin 81 MG tablet Take 81 mg by mouth daily.  . fluticasone (FLONASE) 50 MCG/ACT nasal spray Place 2 sprays into both nostrils daily.  . naproxen (NAPROSYN) 500 MG tablet Take one po bid x 5 days then prn pain, take with food (Patient not taking: Reported on 04/29/2014)  . traMADol (ULTRAM) 50 MG tablet Take 1 tablet (50 mg total) by mouth 2 (two) times daily as needed. (Patient not taking: Reported on 04/29/2014)   No current facility-administered medications on file prior to visit.    Review of Systems  Constitutional:  Negative for fever, chills, activity change, appetite change, fatigue and unexpected weight change.  HENT: Positive for congestion and sinus pressure. Negative for hearing loss.   Eyes: Negative for visual disturbance.  Respiratory: Positive for cough. Negative for chest tightness, shortness of breath and wheezing.   Cardiovascular: Negative for chest pain, palpitations and leg swelling.  Gastrointestinal: Positive for nausea (from drainage). Negative for vomiting, abdominal pain, diarrhea, constipation, blood in stool and abdominal distention.  Genitourinary: Negative for hematuria and difficulty urinating.  Musculoskeletal: Negative for myalgias, arthralgias and neck pain.  Skin: Negative for rash.  Neurological: Negative for dizziness, seizures, syncope and headaches.  Hematological: Negative for adenopathy. Does not bruise/bleed easily.  Psychiatric/Behavioral: Negative for dysphoric mood. The patient is not nervous/anxious.    Per HPI unless specifically indicated above     Objective:    BP 106/62 mmHg  Pulse 62  Temp(Src) 97.9 F (36.6 C) (Oral)  Ht 6\' 3"  (1.905 m)  Wt 208 lb 8 oz (94.575 kg)  BMI 26.06 kg/m2  SpO2 97%  Wt Readings from Last 3 Encounters:  04/29/14 208 lb 8 oz (94.575 kg)  04/12/14 215 lb (97.523 kg)  01/22/14 213 lb (96.616 kg)    Physical Exam  Constitutional: He is oriented to person, place, and time. He appears well-developed and well-nourished. No distress.  HENT:  Head: Normocephalic and atraumatic.  Right Ear: Hearing, tympanic membrane, external ear and ear canal normal.  Left Ear: Hearing, tympanic membrane, external ear and ear canal normal.  Nose: Nose normal.  Mouth/Throat: Uvula is midline, oropharynx is clear and moist and mucous membranes are normal. No oropharyngeal exudate, posterior oropharyngeal edema or posterior oropharyngeal erythema.  Eyes: Conjunctivae and EOM are normal. Pupils are equal, round, and reactive to light. No scleral  icterus.  Neck: Normal range of motion. Neck supple. Carotid bruit is not present. No thyromegaly present.  Cardiovascular: Normal rate, regular rhythm and intact distal pulses.   Murmur (3/6 SEM best at apex) heard. Pulses:      Radial pulses are 2+ on the right side, and 2+ on the left side.  Pulmonary/Chest: Effort normal and breath sounds normal. No respiratory distress. He has no wheezes. He has no rales.  Abdominal: Soft. Bowel sounds are normal. He exhibits no distension and no mass. There is no tenderness. There is no rebound and no guarding.  Genitourinary: Rectum normal and prostate normal. Rectal exam shows no external hemorrhoid, no internal hemorrhoid, no fissure, no mass, no tenderness and anal tone normal. Prostate is not enlarged (20gm) and not tender.  Musculoskeletal: Normal range of motion. He exhibits no edema.  Lymphadenopathy:    He has no cervical adenopathy.  Neurological: He is alert and oriented to person, place, and time.  CN grossly intact, station and gait intact  Skin: Skin is warm and dry. No rash noted.  Psychiatric: He has a normal mood and affect. His behavior is normal. Judgment and thought content normal.  Nursing note and vitals reviewed.      Assessment & Plan:   Problem List Items Addressed This Visit    Sinusitis, chronic    Followed by ENT Dr Carlis Abbott.      HLD (hyperlipidemia)    meds refilled today. Check FLP today.      Relevant Medications   atorvastatin (LIPITOR) tablet   fenofibrate micronized (LOFIBRA) capsule   omega-3 acid ethyl esters (LOVAZA) 1 G capsule   Other Relevant Orders   Lipid panel   Comprehensive metabolic panel   Health care maintenance - Primary    Preventative protocols reviewed and updated unless pt declined. Discussed healthy diet and lifestyle.  Needs cotinine levels checked today.      Relevant Orders   Nicotine/cotinine metabolites    Other Visit Diagnoses    Special screening for malignant neoplasm of  prostate        Relevant Orders    PSA        Follow up plan: Return in about 1 year (around 04/29/2015), or as needed, for annual exam, prior fasting for blood work.

## 2014-04-30 ENCOUNTER — Encounter: Payer: Self-pay | Admitting: Family Medicine

## 2014-04-30 LAB — COMPREHENSIVE METABOLIC PANEL
ALT: 29 U/L (ref 0–53)
AST: 21 U/L (ref 0–37)
Albumin: 4.7 g/dL (ref 3.5–5.2)
Alkaline Phosphatase: 49 U/L (ref 39–117)
BILIRUBIN TOTAL: 1 mg/dL (ref 0.2–1.2)
BUN: 20 mg/dL (ref 6–23)
CO2: 27 meq/L (ref 19–32)
Calcium: 9.8 mg/dL (ref 8.4–10.5)
Chloride: 104 mEq/L (ref 96–112)
Creatinine, Ser: 1.25 mg/dL (ref 0.40–1.50)
GFR: 62.63 mL/min (ref >60.00)
GLUCOSE: 88 mg/dL (ref 70–99)
POTASSIUM: 4.1 meq/L (ref 3.5–5.1)
Sodium: 139 mEq/L (ref 135–145)
Total Protein: 7.1 g/dL (ref 6.0–8.3)

## 2014-04-30 LAB — PSA: PSA: 1.55 ng/mL (ref 0.10–4.00)

## 2014-04-30 LAB — LIPID PANEL
Cholesterol: 171 mg/dL (ref 0–200)
HDL: 33.9 mg/dL — AB (ref >39.00)
LDL Cholesterol: 109 mg/dL — ABNORMAL HIGH (ref 0–99)
NonHDL: 137.1
Total CHOL/HDL Ratio: 5
Triglycerides: 139 mg/dL (ref 0.0–149.0)
VLDL: 27.8 mg/dL (ref 0.0–40.0)

## 2014-04-30 LAB — NICOTINE/COTININE METABOLITES: Cotinine: <10 ng/mL

## 2014-04-30 NOTE — Addendum Note (Signed)
Addended by: Ria Bush on: 04/30/2014 07:21 AM   Modules accepted: Miquel Dunn

## 2014-06-28 ENCOUNTER — Encounter: Payer: Self-pay | Admitting: Family Medicine

## 2014-06-30 NOTE — Telephone Encounter (Signed)
plz see mychart message - do you know anything about this?

## 2014-09-11 ENCOUNTER — Other Ambulatory Visit: Payer: Self-pay | Admitting: Family Medicine

## 2014-09-13 NOTE — Telephone Encounter (Signed)
Dr Darnell Level pt--last filled 04/09/14 and last OV was CPE 04/29/2014--please advise

## 2014-09-13 NOTE — Telephone Encounter (Signed)
Rx called in to pharmacy. 

## 2014-09-13 NOTE — Telephone Encounter (Signed)
Ok to phone in tramadol, naprosyn sent electronically

## 2014-10-18 ENCOUNTER — Telehealth: Payer: Self-pay | Admitting: *Deleted

## 2014-10-18 NOTE — Telephone Encounter (Signed)
I agree. Ok to get here if covered by insurance. May come in for nurse visit.

## 2014-10-18 NOTE — Telephone Encounter (Signed)
Patient called to let you know that his insurance company stated that they will cover for him to have the shingles vaccine at the office and they are not contracted with a pharmacy to have them do it. Patient stated that he is going to call them back and make sure that they will cover it here at the office and he will only have to pay a $15 co pay. Patient does want to make sure that he should go ahead and get the vaccine since he has turned 59. Patient requested a call back to schedule a nurse visit if Dr. Darnell Level agrees that he is to get the vaccine now.

## 2014-10-19 ENCOUNTER — Ambulatory Visit (INDEPENDENT_AMBULATORY_CARE_PROVIDER_SITE_OTHER): Payer: BLUE CROSS/BLUE SHIELD

## 2014-10-19 DIAGNOSIS — Z23 Encounter for immunization: Secondary | ICD-10-CM

## 2014-10-19 NOTE — Progress Notes (Signed)
Pre visit review using our clinic review tool, if applicable. No additional management support is needed unless otherwise documented below in the visit note. 

## 2014-10-19 NOTE — Telephone Encounter (Signed)
Patient notified and appt scheduled.

## 2014-12-16 ENCOUNTER — Encounter: Payer: Self-pay | Admitting: Family Medicine

## 2015-01-05 ENCOUNTER — Encounter: Payer: Self-pay | Admitting: Family Medicine

## 2015-01-05 ENCOUNTER — Ambulatory Visit (INDEPENDENT_AMBULATORY_CARE_PROVIDER_SITE_OTHER): Payer: BLUE CROSS/BLUE SHIELD | Admitting: Family Medicine

## 2015-01-05 VITALS — BP 110/70 | HR 76 | Temp 98.6°F | Wt 215.8 lb

## 2015-01-05 DIAGNOSIS — J392 Other diseases of pharynx: Secondary | ICD-10-CM | POA: Diagnosis not present

## 2015-01-05 MED ORDER — FENOFIBRATE MICRONIZED 134 MG PO CAPS: 134.0000 mg | ORAL_CAPSULE | ORAL | Status: DC

## 2015-01-05 MED ORDER — ATORVASTATIN CALCIUM 10 MG PO TABS: 10.0000 mg | ORAL_TABLET | ORAL | Status: DC

## 2015-01-05 MED ORDER — NYSTATIN 100000 UNIT/ML MT SUSP: 5.0000 mL | Freq: Four times a day (QID) | OROMUCOSAL | Status: DC

## 2015-01-05 NOTE — Progress Notes (Signed)
Pre visit review using our clinic review tool, if applicable. No additional management support is needed unless otherwise documented below in the visit note.  Has dental appointment pending for Monday with possible implants vs filling vs root canal.    H/o esophageal spasm and dilation.  Has had dry mouth since the procedure years ago.   Now with a separate oral concern.  Using biotene daily, frequently sips fluids to compensate.  Wakes regularly at night with dry mouth.  Recently with throat/mouth noted to be dryer than typical. Noted this AM with coating/bumps in the throat.  More irritation in the posterior throat today.  No fevers.  No oral pain.  No cough, but some throat clearing.  No known lymph node enlargement.  Some nasal congestion.   Meds, vitals, and allergies reviewed.   ROS: See HPI.  Otherwise, noncontributory.  GEN: nad, alert and oriented HEENT: mucous membranes moist, tm w/o erythema, nasal exam w/o erythema, some clear discharge noted, scabbing noted in the L nare,  OP with cobblestoning- mild.  No thrush NECK: supple w/o LA CV: rrr.   PULM: ctab, no inc wob

## 2015-01-05 NOTE — Patient Instructions (Signed)
Likely irritation, not thrush.  Gargle with salt water in the meantime.  If you have white patches that erupt, then start nystatin.  Take care.  Glad to see you.

## 2015-01-06 DIAGNOSIS — J392 Other diseases of pharynx: Secondary | ICD-10-CM | POA: Insufficient documentation

## 2015-01-06 NOTE — Assessment & Plan Note (Signed)
No thrush on exam.  I think he has noted soft palate changes/irritation.  Not c/w strep.  More likely from viral infection vs post nasal gtt.  D/w pt.   Gargle with salt water for now.  I did given him rx to hold for nystatin in case he does develop thrush sx- d/w pt.  He is at risk given his chronic dry mouth.   He agrees.  He'll f/u with the dental clinic Monday.

## 2015-02-20 HISTORY — PX: NASAL SINUS SURGERY: SHX719

## 2015-03-07 ENCOUNTER — Encounter: Payer: Self-pay | Admitting: Family Medicine

## 2015-03-08 ENCOUNTER — Ambulatory Visit (INDEPENDENT_AMBULATORY_CARE_PROVIDER_SITE_OTHER): Payer: BLUE CROSS/BLUE SHIELD | Admitting: Family Medicine

## 2015-03-08 ENCOUNTER — Encounter: Payer: Self-pay | Admitting: Family Medicine

## 2015-03-08 VITALS — BP 112/70 | HR 64 | Temp 98.4°F | Wt 211.5 lb

## 2015-03-08 DIAGNOSIS — J019 Acute sinusitis, unspecified: Secondary | ICD-10-CM | POA: Diagnosis not present

## 2015-03-08 DIAGNOSIS — R21 Rash and other nonspecific skin eruption: Secondary | ICD-10-CM

## 2015-03-08 MED ORDER — TRAMADOL HCL 50 MG PO TABS: ORAL_TABLET | ORAL | Status: DC

## 2015-03-08 MED ORDER — DOXYCYCLINE HYCLATE 100 MG PO TABS: 100.0000 mg | ORAL_TABLET | Freq: Two times a day (BID) | ORAL | Status: DC

## 2015-03-08 NOTE — Patient Instructions (Addendum)
I'm not sure about where rash came from - it seems to be a skin reaction to some exposure (dermatitis). Watch for any new exposures, watch for naprosyn.  You likely do have a sinus infection. Take medicine as prescribed: complete 10 day doxycycline course.  Push fluids and plenty of rest. Nasal saline irrigation or neti pot to help drain sinuses. May use plain mucinex with plenty of fluid to help mobilize mucous. Please let us know if fever >101.5, trouble opening/closing mouth, difficulty swallowing, or worsening instead of improving as expected.

## 2015-03-08 NOTE — Assessment & Plan Note (Signed)
Acute on chronic sinusitis. ?viral. Pt already started doxy course. Will prolong for total 10d course. Update if sxs persist or fail to improve. Pt agrees. Further supportive care per instructions

## 2015-03-08 NOTE — Progress Notes (Signed)
BP 112/70 mmHg  Pulse 64  Temp(Src) 98.4 F (36.9 C) (Oral)  Wt 211 lb 8 oz (95.936 kg)   CC: sinusitis, rash  Subjective:    Patient ID: Ethan Austin, male    DOB: [birth year], 60 y.o.   MRN: [id number]  HPI: Ethan Austin is a 61 y.o. male presenting on 03/08/2015 for Rash and Sinusitis   Several weeks ago developed intermittent rashes on body - back, legs, face and chest, ?eyelid. Back rash was itchy, rest not.  No new medicines, lotions, detergents, soaps or shampoos.  Worse 4d ago, slowly resolving.   1 + wk h/o R nasal congestion, muffled hearing, R external ear sore, PNdrainage.  No fevers/cihlls, ST, tooth pain, headaches, coughing.  Has been treating with nasal sinus flush. Also started left over doxycycline by Dr Carlis Abbott at [facility] for the last 3 days.  H/o sinus surgery (Dr Carlis Abbott)  H/o TMJ but this feels different.   Request DRE/PSA at CPE.   Relevant past medical, surgical, family and social history reviewed and updated as indicated. Interim medical history since our last visit reviewed. Allergies and medications reviewed and updated. Current Outpatient Prescriptions on File Prior to Visit  Medication Sig  . aspirin 81 MG tablet Take 81 mg by mouth daily.  Marland Kitchen atorvastatin (LIPITOR) 10 MG tablet Take 1 tablet (10 mg total) by mouth every other day.  . fenofibrate micronized (LOFIBRA) 134 MG capsule Take 1 capsule (134 mg total) by mouth every other day.  . fluticasone (FLONASE) 50 MCG/ACT nasal spray Place 2 sprays into both nostrils daily.  . naproxen (NAPROSYN) 500 MG tablet TAKE ONE TABLET TWICE A DAY X 5 DAYS THEN PRN PAIN, TAKE WITH FOOD  . omega-3 acid ethyl esters (LOVAZA) 1 G capsule Take 4 capsules (4 g total) by mouth daily.  Marland Kitchen omeprazole (PRILOSEC) 20 MG capsule Take 1 capsule (20 mg total) by mouth daily.   No current facility-administered medications on file prior to visit.    Review of Systems Per HPI unless specifically indicated in ROS  section     Objective:    BP 112/70 mmHg  Pulse 64  Temp(Src) 98.4 F (36.9 C) (Oral)  Wt 211 lb 8 oz (95.936 kg)  Wt Readings from Last 3 Encounters:  03/08/15 211 lb 8 oz (95.936 kg)  01/05/15 215 lb 12 oz (97.864 kg)  04/29/14 208 lb 8 oz (94.575 kg)    Physical Exam  Constitutional: He appears well-developed and well-nourished. No distress.  HENT:  Head: Normocephalic and atraumatic.  Right Ear: Hearing, external ear and ear canal normal.  Left Ear: Hearing, tympanic membrane, external ear and ear canal normal.  Nose: Mucosal edema present. No rhinorrhea. Right sinus exhibits no maxillary sinus tenderness and no frontal sinus tenderness. Left sinus exhibits no maxillary sinus tenderness and no frontal sinus tenderness.  Mouth/Throat: Uvula is midline, oropharynx is clear and moist and mucous membranes are normal. No oropharyngeal exudate, posterior oropharyngeal edema, posterior oropharyngeal erythema or tonsillar abscesses.  Fluid behind R TM  Eyes: Conjunctivae and EOM are normal. Pupils are equal, round, and reactive to light. No scleral icterus.  Neck: Normal range of motion. Neck supple.  Cardiovascular: Normal rate, regular rhythm, normal heart sounds and intact distal pulses.   No murmur heard. Pulmonary/Chest: Effort normal and breath sounds normal. No respiratory distress. He has no wheezes. He has no rales.  Lymphadenopathy:    He has no cervical adenopathy.  Skin: Skin is warm  and dry. Rash noted.  Faint erythematous macular patches on skin (few on back, chest, legs) No vesicular rash. No significant pruritis  Nursing note and vitals reviewed.     Assessment & Plan:   Problem List Items Addressed This Visit    Skin rash    Dermatitis of unclear cause. ?naprosyn (only relatively new med). Monitor for now, discussed trial antihistamine. Update if persistent for further evaluation/treatment, consider steroid cream.      Acute sinusitis - Primary    Acute on  chronic sinusitis. ?viral. Pt already started doxy course. Will prolong for total 10d course. Update if sxs persist or fail to improve. Pt agrees. Further supportive care per instructions      Relevant Medications   doxycycline (VIBRA-TABS) 100 MG tablet       Follow up plan: Return if symptoms worsen or fail to improve.

## 2015-03-08 NOTE — Assessment & Plan Note (Signed)
Dermatitis of unclear cause. ?naprosyn (only relatively new med). Monitor for now, discussed trial antihistamine. Update if persistent for further evaluation/treatment, consider steroid cream.

## 2015-03-08 NOTE — Progress Notes (Signed)
Pre visit review using our clinic review tool, if applicable. No additional management support is needed unless otherwise documented below in the visit note. 

## 2015-03-21 ENCOUNTER — Ambulatory Visit (INDEPENDENT_AMBULATORY_CARE_PROVIDER_SITE_OTHER): Payer: BLUE CROSS/BLUE SHIELD | Admitting: Family Medicine

## 2015-03-21 ENCOUNTER — Encounter: Payer: Self-pay | Admitting: Family Medicine

## 2015-03-21 ENCOUNTER — Telehealth: Payer: Self-pay | Admitting: *Deleted

## 2015-03-21 ENCOUNTER — Encounter: Payer: Self-pay | Admitting: *Deleted

## 2015-03-21 VITALS — BP 146/70 | HR 68 | Temp 98.1°F | Wt 209.0 lb

## 2015-03-21 DIAGNOSIS — R21 Rash and other nonspecific skin eruption: Secondary | ICD-10-CM

## 2015-03-21 LAB — COMPREHENSIVE METABOLIC PANEL
ALK PHOS: 44 U/L (ref 39–117)
ALT: 33 U/L (ref 0–53)
AST: 25 U/L (ref 0–37)
Albumin: 4.6 g/dL (ref 3.5–5.2)
BILIRUBIN TOTAL: 1 mg/dL (ref 0.2–1.2)
BUN: 12 mg/dL (ref 6–23)
CO2: 28 mEq/L (ref 19–32)
CREATININE: 1.21 mg/dL (ref 0.40–1.50)
Calcium: 9.6 mg/dL (ref 8.4–10.5)
Chloride: 105 mEq/L (ref 96–112)
GFR: 64.83 mL/min (ref >60.00)
GLUCOSE: 158 mg/dL — AB (ref 70–99)
Potassium: 3.7 mEq/L (ref 3.5–5.1)
SODIUM: 141 meq/L (ref 135–145)
TOTAL PROTEIN: 7 g/dL (ref 6.0–8.3)

## 2015-03-21 LAB — CBC WITH DIFFERENTIAL/PLATELET
BASOS ABS: 0 10*3/uL (ref 0.0–0.1)
Basophils Relative: 0.2 % (ref 0.0–3.0)
EOS ABS: 0.1 10*3/uL (ref 0.0–0.7)
Eosinophils Relative: 2.3 % (ref 0.0–5.0)
HCT: 46.4 % (ref 39.0–52.0)
Hemoglobin: 15.6 g/dL (ref 13.0–17.0)
LYMPHS ABS: 0.8 10*3/uL (ref 0.7–4.0)
Lymphocytes Relative: 13 % (ref 12.0–46.0)
MCHC: 33.5 g/dL (ref 30.0–36.0)
MCV: 88.6 fl (ref 78.0–100.0)
MONO ABS: 0.3 10*3/uL (ref 0.1–1.0)
MONOS PCT: 5 % (ref 3.0–12.0)
NEUTROS PCT: 79.5 % — AB (ref 43.0–77.0)
Neutro Abs: 5 10*3/uL (ref 1.4–7.7)
Platelets: 213 10*3/uL (ref 150.0–400.0)
RBC: 5.24 Mil/uL (ref 4.22–5.81)
RDW: 13.8 % (ref 11.5–15.5)
WBC: 6.3 10*3/uL (ref 4.0–10.5)

## 2015-03-21 LAB — SEDIMENTATION RATE: Sed Rate: 1 mm/hr (ref 0–22)

## 2015-03-21 MED ORDER — PREDNISONE 20 MG PO TABS: ORAL_TABLET | ORAL | Status: DC

## 2015-03-21 NOTE — Progress Notes (Signed)
Pre visit review using our clinic review tool, if applicable. No additional management support is needed unless otherwise documented below in the visit note. 

## 2015-03-21 NOTE — Telephone Encounter (Signed)
Already scheduled

## 2015-03-21 NOTE — Patient Instructions (Signed)
I think this is from doxycycline photosensitivity. Take prednisone course labwork today. Out of work 2 days. Update [place] with effect.

## 2015-03-21 NOTE — Telephone Encounter (Signed)
plz call and schedule appt?

## 2015-03-21 NOTE — Assessment & Plan Note (Signed)
Anticipate current facial erythema due to photosensitivity reaction from recent doxycycline course. Discussed with patient. Treat with prednisone taper.  Check ESR, CBC, CMP today.  Update if persistent rash after treatment, sooner if any worsening .

## 2015-03-21 NOTE — Telephone Encounter (Signed)
[facility] Patient Name: Ethan Austin Gender: Male DOB: 19-Apr-1955 Age: 60 Y 9 M 27 D Return Phone Number: [phone] (Primary) Address: City/State/Zip: [place] Client [facility] - Client Client Site [place] [facility] [place], [place] Type Call Caller Name Faolan Honkomp Phone Number [phone] Relationship To Patient Self Is this call to report lab results? No Call Type General Information Initial Comment Caller states his face and neck is red and has red spots on his upper body. He needs to schedule an appointment ASAP. General Information Type Appointment Nurse Assessment Guidelines Guideline Title Affirmed Question Affirmed Notes Nurse Date/Time (Eastern Time) Disp. Time Eilene Ghazi Time) Disposition Final User 03/21/2015 6:17:33 AM General Information Provided Yes Gae Gallop After Care Instructions Given Call Event Type User Date / Time Description

## 2015-03-21 NOTE — Progress Notes (Signed)
BP 146/70 mmHg  Pulse 68  Temp(Src) 98.1 F (36.7 C) (Oral)  Wt 209 lb (94.802 kg)   CC: rash  Subjective:    Patient ID: Ethan Austin, male    DOB: 05-04-1955, 60 y.o.   MRN: [id number]  HPI: DESTEN MANOR is a 60 y.o. male presenting on 03/21/2015 for Rash   Seen here 03/08/2015 with acute sinusitis, treated with doxy course. Off doxycycline since 1/24.   Last night noticed redness across face that spread down to chest. Also feeling hot. No fever. Mouth feels dry and lips feel dried out. Took nyquil last night. No new lotions, detergents, soaps or shampoos. No oral lesions.   This is different than prior rash from last office visit: 1.5 mo ago developed intermittent rashes on body - back, legs, face and chest, ?eyelid. Back rash was itchy, rest not.  No new medicines, lotions, detergents, soaps or shampoos.  Worse 4d ago, slowly resolving. Last visit described as faint erythematous macular patches on skin without pain/pruritis/veiscles.   Request DRE/PSA at CPE.   Relevant past medical, surgical, family and social history reviewed and updated as indicated. Interim medical history since our last visit reviewed. Allergies and medications reviewed and updated. Current Outpatient Prescriptions on File Prior to Visit  Medication Sig  . aspirin 81 MG tablet Take 81 mg by mouth daily.  Marland Kitchen atorvastatin (LIPITOR) 10 MG tablet Take 1 tablet (10 mg total) by mouth every other day.  . fenofibrate micronized (LOFIBRA) 134 MG capsule Take 1 capsule (134 mg total) by mouth every other day.  . fluticasone (FLONASE) 50 MCG/ACT nasal spray Place 2 sprays into both nostrils daily.  . naproxen (NAPROSYN) 500 MG tablet TAKE ONE TABLET TWICE A DAY X 5 DAYS THEN PRN PAIN, TAKE WITH FOOD  . omega-3 acid ethyl esters (LOVAZA) 1 G capsule Take 4 capsules (4 g total) by mouth daily.  Marland Kitchen omeprazole (PRILOSEC) 20 MG capsule Take 1 capsule (20 mg total) by mouth daily.  . traMADol (ULTRAM) 50 MG  tablet TAKE 1 TABLET BY MOUTH 2 TIMES A DAY AS NEEDED FOR PAIN   No current facility-administered medications on file prior to visit.    Review of Systems Per HPI unless specifically indicated in ROS section     Objective:    BP 146/70 mmHg  Pulse 68  Temp(Src) 98.1 F (36.7 C) (Oral)  Wt 209 lb (94.802 kg)  Wt Readings from Last 3 Encounters:  03/21/15 209 lb (94.802 kg)  03/08/15 211 lb 8 oz (95.936 kg)  01/05/15 215 lb 12 oz (97.864 kg)    Physical Exam  Constitutional: He appears well-developed and well-nourished. No distress.  Skin: Skin is warm and dry. Rash noted. There is erythema.  Burning erythema present on face and neck, spares scalp Faint erythematous macular patches on skin (few on back, chest, legs) No vesicular rash. No significant pruritis  Psychiatric: He has a normal mood and affect.  Nursing note and vitals reviewed.     Assessment & Plan:   Problem List Items Addressed This Visit    Skin rash - Primary    Anticipate current facial erythema due to photosensitivity reaction from recent doxycycline course. Discussed with patient. Treat with prednisone taper.  Check ESR, CBC, CMP today.  Update if persistent rash after treatment, sooner if any worsening .       Relevant Orders   CBC with Differential/Platelet   Comprehensive metabolic panel   Sedimentation rate  Follow up plan: Return if symptoms worsen or fail to improve.

## 2015-03-22 ENCOUNTER — Encounter: Payer: Self-pay | Admitting: Family Medicine

## 2015-04-25 ENCOUNTER — Other Ambulatory Visit (INDEPENDENT_AMBULATORY_CARE_PROVIDER_SITE_OTHER): Payer: BLUE CROSS/BLUE SHIELD

## 2015-04-25 ENCOUNTER — Other Ambulatory Visit: Payer: Self-pay | Admitting: Family Medicine

## 2015-04-25 DIAGNOSIS — E785 Hyperlipidemia, unspecified: Secondary | ICD-10-CM

## 2015-04-25 DIAGNOSIS — Z125 Encounter for screening for malignant neoplasm of prostate: Secondary | ICD-10-CM

## 2015-04-25 DIAGNOSIS — Z0000 Encounter for general adult medical examination without abnormal findings: Secondary | ICD-10-CM

## 2015-04-26 LAB — LIPID PANEL
Cholesterol: 146 mg/dL (ref 0–200)
HDL: 38.2 mg/dL — ABNORMAL LOW (ref >39.00)
LDL Cholesterol: 86 mg/dL (ref 0–99)
NONHDL: 107.98
Total CHOL/HDL Ratio: 4
Triglycerides: 109 mg/dL (ref 0.0–149.0)
VLDL: 21.8 mg/dL (ref 0.0–40.0)

## 2015-04-26 LAB — PSA: PSA: 1.12 ng/mL (ref 0.10–4.00)

## 2015-04-26 LAB — BASIC METABOLIC PANEL
BUN: 18 mg/dL (ref 6–23)
CALCIUM: 9.5 mg/dL (ref 8.4–10.5)
CO2: 27 mEq/L (ref 19–32)
Chloride: 105 mEq/L (ref 96–112)
Creatinine, Ser: 1.23 mg/dL (ref 0.40–1.50)
GFR: 63.6 mL/min (ref >60.00)
GLUCOSE: 87 mg/dL (ref 70–99)
Potassium: 4.1 mEq/L (ref 3.5–5.1)
Sodium: 143 mEq/L (ref 135–145)

## 2015-04-26 LAB — NICOTINE/COTININE METABOLITES: Cotinine: <10 ng/mL

## 2015-05-02 ENCOUNTER — Encounter: Payer: Self-pay | Admitting: Family Medicine

## 2015-05-02 ENCOUNTER — Ambulatory Visit (INDEPENDENT_AMBULATORY_CARE_PROVIDER_SITE_OTHER): Payer: BLUE CROSS/BLUE SHIELD | Admitting: Family Medicine

## 2015-05-02 VITALS — BP 124/78 | HR 68 | Temp 98.0°F | Ht 75.0 in | Wt 214.5 lb

## 2015-05-02 DIAGNOSIS — E785 Hyperlipidemia, unspecified: Secondary | ICD-10-CM

## 2015-05-02 DIAGNOSIS — N401 Enlarged prostate with lower urinary tract symptoms: Secondary | ICD-10-CM

## 2015-05-02 DIAGNOSIS — J329 Chronic sinusitis, unspecified: Secondary | ICD-10-CM

## 2015-05-02 DIAGNOSIS — I341 Nonrheumatic mitral (valve) prolapse: Secondary | ICD-10-CM | POA: Insufficient documentation

## 2015-05-02 DIAGNOSIS — Z0000 Encounter for general adult medical examination without abnormal findings: Secondary | ICD-10-CM | POA: Diagnosis not present

## 2015-05-02 DIAGNOSIS — N138 Other obstructive and reflux uropathy: Secondary | ICD-10-CM | POA: Insufficient documentation

## 2015-05-02 DIAGNOSIS — R351 Nocturia: Secondary | ICD-10-CM

## 2015-05-02 MED ORDER — ATORVASTATIN CALCIUM 10 MG PO TABS: 10.0000 mg | ORAL_TABLET | ORAL | Status: DC

## 2015-05-02 MED ORDER — ATORVASTATIN CALCIUM 10 MG PO TABS: 10.0000 mg | ORAL_TABLET | Freq: Every day | ORAL | Status: DC

## 2015-05-02 MED ORDER — OMEPRAZOLE 20 MG PO CPDR: 20.0000 mg | DELAYED_RELEASE_CAPSULE | Freq: Every day | ORAL | Status: DC

## 2015-05-02 MED ORDER — OMEGA-3-ACID ETHYL ESTERS 1 G PO CAPS: 2.0000 g | ORAL_CAPSULE | Freq: Every day | ORAL | Status: DC

## 2015-05-02 MED ORDER — OMEGA-3-ACID ETHYL ESTERS 1 G PO CAPS
4.0000 g | ORAL_CAPSULE | Freq: Every day | ORAL | Status: DC
Start: 2015-05-02 — End: 2016-08-26

## 2015-05-02 MED ORDER — FENOFIBRATE MICRONIZED 134 MG PO CAPS: 134.0000 mg | ORAL_CAPSULE | ORAL | Status: DC

## 2015-05-02 MED ORDER — FENOFIBRATE MICRONIZED 134 MG PO CAPS: 134.0000 mg | ORAL_CAPSULE | Freq: Every day | ORAL | Status: DC

## 2015-05-02 NOTE — Assessment & Plan Note (Signed)
Preventative protocols reviewed and updated unless pt declined. Discussed healthy diet and lifestyle.  

## 2015-05-02 NOTE — Assessment & Plan Note (Signed)
Chronic, stable 

## 2015-05-02 NOTE — Progress Notes (Signed)
BP 124/78 mmHg  Pulse 68  Temp(Src) 98 F (36.7 C) (Oral)  Ht 6\' 3"  (1.905 m)  Wt 214 lb 8 oz (97.297 kg)  BMI 26.81 kg/m2   CC: CPE  Subjective:    Patient ID: Ethan Austin, male    DOB: Aug 25, 1955, 60 y.o.   MRN: [id number]  HPI: Ethan Austin is a 60 y.o. male presenting on 05/02/2015 for Annual Exam   Azithromycin 250mg  daily by ENT - pending CT scan. Carlis Abbott)   Preventative: Colonoscopy 2008 WNL per patient, rec rpt 10 years.  Prostate cancer screening - discussed, would like screening. Nocturia x1 with weakening of stream Flu - 11/2014 Tdap - 2014 zostavax 2016 Seat belt use discussed Sunscreen use discussed. No changing moles on skin. H/o BCC   Caffeine: 2 cups coffee in am, 1 tea at night Lives with wife and 2 children Occupation: Armed forces logistics/support/administrative officer at [employer]: some college Activity: [employer] with wife Diet: good water, fruits/vegetables daily  Relevant past medical, surgical, family and social history reviewed and updated as indicated. Interim medical history since our last visit reviewed. Allergies and medications reviewed and updated. Current Outpatient Prescriptions on File Prior to Visit  Medication Sig  . aspirin 81 MG tablet Take 81 mg by mouth daily.  . naproxen (NAPROSYN) 500 MG tablet TAKE ONE TABLET TWICE A DAY X 5 DAYS THEN PRN PAIN, TAKE WITH FOOD  . traMADol (ULTRAM) 50 MG tablet TAKE 1 TABLET BY MOUTH 2 TIMES A DAY AS NEEDED FOR PAIN  . fluticasone (FLONASE) 50 MCG/ACT nasal spray Place 2 sprays into both nostrils daily. (Patient not taking: Reported on 05/02/2015)   No current facility-administered medications on file prior to visit.    Review of Systems  Constitutional: Negative for fever, chills, activity change, appetite change, fatigue and unexpected weight change.  HENT: Negative for hearing loss.        Dry mouth  Eyes: Negative for visual disturbance.  Respiratory: Negative for cough, chest tightness, shortness of  breath and wheezing.   Cardiovascular: Negative for chest pain, palpitations and leg swelling.  Gastrointestinal: Negative for nausea, vomiting, abdominal pain, diarrhea, constipation, blood in stool and abdominal distention.  Genitourinary: Negative for hematuria and difficulty urinating.  Musculoskeletal: Negative for myalgias, arthralgias and neck pain.  Skin: Negative for rash.  Neurological: Negative for dizziness, seizures, syncope and headaches.  Hematological: Negative for adenopathy. Does not bruise/bleed easily.  Psychiatric/Behavioral: Negative for dysphoric mood. The patient is not nervous/anxious.    Per HPI unless specifically indicated in ROS section     Objective:    BP 124/78 mmHg  Pulse 68  Temp(Src) 98 F (36.7 C) (Oral)  Ht 6\' 3"  (1.905 m)  Wt 214 lb 8 oz (97.297 kg)  BMI 26.81 kg/m2  Wt Readings from Last 3 Encounters:  05/02/15 214 lb 8 oz (97.297 kg)  03/21/15 209 lb (94.802 kg)  03/08/15 211 lb 8 oz (95.936 kg)    Physical Exam  Constitutional: He is oriented to person, place, and time. He appears well-developed and well-nourished. No distress.  HENT:  Head: Normocephalic and atraumatic.  Right Ear: Hearing, tympanic membrane, external ear and ear canal normal.  Left Ear: Hearing, tympanic membrane, external ear and ear canal normal.  Nose: Nose normal.  Mouth/Throat: Uvula is midline, oropharynx is clear and moist and mucous membranes are normal. No oropharyngeal exudate, posterior oropharyngeal edema or posterior oropharyngeal erythema.  Eyes: Conjunctivae and EOM are normal. Pupils are  equal, round, and reactive to light. No scleral icterus.  Neck: Normal range of motion. Neck supple. No thyromegaly present.  Cardiovascular: Normal rate, regular rhythm and intact distal pulses.   Murmur (3/6 systolic loudest at apex) heard. Pulses:      Radial pulses are 2+ on the right side, and 2+ on the left side.  Pulmonary/Chest: Effort normal and breath sounds  normal. No respiratory distress. He has no wheezes. He has no rales.  Abdominal: Soft. Bowel sounds are normal. He exhibits no distension and no mass. There is no tenderness. There is no rebound and no guarding.  Genitourinary: Rectum normal and prostate normal. Rectal exam shows no external hemorrhoid, no internal hemorrhoid, no fissure, no mass, no tenderness and anal tone normal. Prostate is not enlarged (20gm) and not tender.  Musculoskeletal: Normal range of motion. He exhibits no edema.  Lymphadenopathy:    He has no cervical adenopathy.  Neurological: He is alert and oriented to person, place, and time.  CN grossly intact, station and gait intact  Skin: Skin is warm and dry. No rash noted.  Psychiatric: He has a normal mood and affect. His behavior is normal. Judgment and thought content normal.  Nursing note and vitals reviewed.  Results for orders placed or performed in visit on 04/25/15  Nicotine/cotinine metabolites  Result Value Ref Range   Cotinine <10 ng/mL  Basic metabolic panel  Result Value Ref Range   Sodium 143 135 - 145 mEq/L   Potassium 4.1 3.5 - 5.1 mEq/L   Chloride 105 96 - 112 mEq/L   CO2 27 19 - 32 mEq/L   Glucose, Bld 87 70 - 99 mg/dL   BUN 18 6 - 23 mg/dL   Creatinine, Ser 1.23 0.40 - 1.50 mg/dL   Calcium 9.5 8.4 - 10.5 mg/dL   GFR 63.60 >60.00 mL/min  Lipid panel  Result Value Ref Range   Cholesterol 146 0 - 200 mg/dL   Triglycerides 109.0 0.0 - 149.0 mg/dL   HDL 38.20 (L) >39.00 mg/dL   VLDL 21.8 0.0 - 40.0 mg/dL   LDL Cholesterol 86 0 - 99 mg/dL   Total CHOL/HDL Ratio 4    NonHDL 107.98   PSA  Result Value Ref Range   PSA 1.12 0.10 - 4.00 ng/mL      Assessment & Plan:   Problem List Items Addressed This Visit    Sinusitis, chronic    Has f/u scheduled with ENT Dr Carlis Abbott.       Relevant Medications   azithromycin (ZITHROMAX) 250 MG tablet   MVP (mitral valve prolapse)    Chronic, stable      Relevant Medications   fenofibrate  micronized (LOFIBRA) 134 MG capsule   atorvastatin (LIPITOR) 10 MG tablet   omega-3 acid ethyl esters (LOVAZA) 1 g capsule   HLD (hyperlipidemia)    Chronic, stable on current med regimen. Continue.  Takes lipitor/fibrate QOD and lovaza 2gm daily.       Relevant Medications   fenofibrate micronized (LOFIBRA) 134 MG capsule   atorvastatin (LIPITOR) 10 MG tablet   omega-3 acid ethyl esters (LOVAZA) 1 g capsule   Health care maintenance - Primary    Preventative protocols reviewed and updated unless pt declined. Discussed healthy diet and lifestyle.       Benign prostatic hypertrophy (BPH) with nocturia    Mild symptoms, discussed pharmacotherapy as well as herbal remedies. Declines treatment for now.          Follow up plan:  Return in about 1 year (around 05/01/2016), or as needed, for annual exam, prior fasting for blood work.

## 2015-05-02 NOTE — Progress Notes (Signed)
Pre visit review using our clinic review tool, if applicable. No additional management support is needed unless otherwise documented below in the visit note. 

## 2015-05-02 NOTE — Patient Instructions (Addendum)
Continue current medications.  Cholesterol levels are doing well.   Health Maintenance, Male A healthy lifestyle and preventative care can promote health and wellness.  Maintain regular health, dental, and eye exams.  Eat a healthy diet. Foods like vegetables, fruits, whole grains, low-fat dairy products, and lean protein foods contain the nutrients you need and are low in calories. Decrease your intake of foods high in solid fats, added sugars, and salt. Get information about a proper diet from your health care provider, if necessary.  Regular physical exercise is one of the most important things you can do for your health. Most adults should get at least 150 minutes of moderate-intensity exercise (any activity that increases your heart rate and causes you to sweat) each week. In addition, most adults need muscle-strengthening exercises on 2 or more days a week.   Maintain a healthy weight. The body mass index (BMI) is a screening tool to identify possible weight problems. It provides an estimate of body fat based on height and weight. Your health care provider can find your BMI and can help you achieve or maintain a healthy weight. For males 20 years and older:  A BMI below 18.5 is considered underweight.  A BMI of 18.5 to 24.9 is normal.  A BMI of 25 to 29.9 is considered overweight.  A BMI of 30 and above is considered obese.  Maintain normal blood lipids and cholesterol by exercising and minimizing your intake of saturated fat. Eat a balanced diet with plenty of fruits and vegetables. Blood tests for lipids and cholesterol should begin at age 63 and be repeated every 5 years. If your lipid or cholesterol levels are high, you are over age 34, or you are at high risk for heart disease, you may need your cholesterol levels checked more frequently.Ongoing high lipid and cholesterol levels should be treated with medicines if diet and exercise are not working.  If you smoke, find out from  your health care provider how to quit. If you do not use tobacco, do not start.  Lung cancer screening is recommended for adults aged 68-80 years who are at high risk for developing lung cancer because of a history of smoking. A yearly low-dose CT scan of the lungs is recommended for people who have at least a 30-pack-year history of smoking and are current smokers or have quit within the past 15 years. A pack year of smoking is smoking an average of 1 pack of cigarettes a day for 1 year (for example, a 30-pack-year history of smoking could mean smoking 1 pack a day for 30 years or 2 packs a day for 15 years). Yearly screening should continue until the smoker has stopped smoking for at least 15 years. Yearly screening should be stopped for people who develop a health problem that would prevent them from having lung cancer treatment.  If you choose to drink alcohol, do not have more than 2 drinks per day. One drink is considered to be 12 oz (360 mL) of beer, 5 oz (150 mL) of wine, or 1.5 oz (45 mL) of liquor.  Avoid the use of street drugs. Do not share needles with anyone. Ask for help if you need support or instructions about stopping the use of drugs.  High blood pressure causes heart disease and increases the risk of stroke. High blood pressure is more likely to develop in:  People who have blood pressure in the end of the normal range (100-139/85-89 mm Hg).  People who  are overweight or obese.  People who are African American.  If you are 54-16 years of age, have your blood pressure checked every 3-5 years. If you are 91 years of age or older, have your blood pressure checked every year. You should have your blood pressure measured twice--once when you are at a hospital or clinic, and once when you are not at a hospital or clinic. Record the average of the two measurements. To check your blood pressure when you are not at a hospital or clinic, you can use:  An automated blood pressure machine  at a pharmacy.  A home blood pressure monitor.  If you are 68-41 years old, ask your health care provider if you should take aspirin to prevent heart disease.  Diabetes screening involves taking a blood sample to check your fasting blood sugar level. This should be done once every 3 years after age 33 if you are at a normal weight and without risk factors for diabetes. Testing should be considered at a younger age or be carried out more frequently if you are overweight and have at least 1 risk factor for diabetes.  Colorectal cancer can be detected and often prevented. Most routine colorectal cancer screening begins at the age of 45 and continues through age 53. However, your health care provider may recommend screening at an earlier age if you have risk factors for colon cancer. On a yearly basis, your health care provider may provide home test kits to check for hidden blood in the stool. A small camera at the end of a tube may be used to directly examine the colon (sigmoidoscopy or colonoscopy) to detect the earliest forms of colorectal cancer. Talk to your health care provider about this at age 67 when routine screening begins. A direct exam of the colon should be repeated every 5-10 years through age 34, unless early forms of precancerous polyps or small growths are found.  People who are at an increased risk for hepatitis B should be screened for this virus. You are considered at high risk for hepatitis B if:  You were born in a country where hepatitis B occurs often. Talk with your health care provider about which countries are considered high risk.  Your parents were born in a high-risk country and you have not received a shot to protect against hepatitis B (hepatitis B vaccine).  You have HIV or AIDS.  You use needles to inject street drugs.  You live with, or have sex with, someone who has hepatitis B.  You are a man who has sex with other men (MSM).  You get hemodialysis  treatment.  You take certain medicines for conditions like cancer, organ transplantation, and autoimmune conditions.  Hepatitis C blood testing is recommended for all people born from [birth year] through 1965 and any individual with known risk factors for hepatitis C.  Healthy men should no longer receive prostate-specific antigen (PSA) blood tests as part of routine cancer screening. Talk to your health care provider about prostate cancer screening.  Testicular cancer screening is not recommended for adolescents or adult males who have no symptoms. Screening includes self-exam, a health care provider exam, and other screening tests. Consult with your health care provider about any symptoms you have or any concerns you have about testicular cancer.  Practice safe sex. Use condoms and avoid high-risk sexual practices to reduce the spread of sexually transmitted infections (STIs).  You should be screened for STIs, including gonorrhea and chlamydia if:  You  are sexually active and are younger than 24 years.  You are older than 24 years, and your health care provider tells you that you are at risk for this type of infection.  Your sexual activity has changed since you were last screened, and you are at an increased risk for chlamydia or gonorrhea. Ask your health care provider if you are at risk.  If you are at risk of being infected with HIV, it is recommended that you take a prescription medicine daily to prevent HIV infection. This is called pre-exposure prophylaxis (PrEP). You are considered at risk if:  You are a man who has sex with other men (MSM).  You are a heterosexual man who is sexually active with multiple partners.  You take drugs by injection.  You are sexually active with a partner who has HIV.  Talk with your health care provider about whether you are at high risk of being infected with HIV. If you choose to begin PrEP, you should first be tested for HIV. You should then be tested  every 3 months for as long as you are taking PrEP.  Use sunscreen. Apply sunscreen liberally and repeatedly throughout the day. You should seek shade when your shadow is shorter than you. Protect yourself by wearing long sleeves, pants, a wide-brimmed hat, and sunglasses year round whenever you are outdoors.  Tell your health care provider of new moles or changes in moles, especially if there is a change in shape or color. Also, tell your health care provider if a mole is larger than the size of a pencil eraser.  A one-time screening for abdominal aortic aneurysm (AAA) and surgical repair of large AAAs by ultrasound is recommended for men aged 29-75 years who are current or former smokers.  Stay current with your vaccines (immunizations).   This information is not intended to replace advice given to you by your health care provider. Make sure you discuss any questions you have with your health care provider.   Document Released: 08/04/2007 Document Revised: 02/26/2014 Document Reviewed: 07/03/2010 Elsevier Interactive Patient Education [employer].

## 2015-05-02 NOTE — Assessment & Plan Note (Addendum)
Chronic, stable on current med regimen. Continue.  Takes lipitor/fibrate QOD and lovaza 2gm daily.

## 2015-05-02 NOTE — Assessment & Plan Note (Signed)
Mild symptoms, discussed pharmacotherapy as well as herbal remedies. Declines treatment for now.

## 2015-05-02 NOTE — Assessment & Plan Note (Signed)
Has f/u scheduled with ENT Dr Carlis Abbott.

## 2015-05-03 ENCOUNTER — Telehealth: Payer: Self-pay

## 2015-05-03 NOTE — Telephone Encounter (Signed)
Ethan Austin with [facility] left v/m wanting clarification of instructions for 3 meds; [facility] received duplicate rxs with different instructions on 05/02/15 for fenofibrate 134 mg and atorvastatin 10 mg one set of instructions were take one daily and the other rx was take one QOD; omega 3(Lovaza) was take 4 caps daily and the other rx was take 2 caps daily. The med list has atorvastatin 10 mg taking one daily, fenofibrate 134 mg take one daily and Lovaza take 4 caps daily. In the 05/02/15 note it was mentioned pt taking fenofibrate and atorvastatin QOD and lovaza 2 caps daily.Please advise. When calling [facility] use ref # [id number].

## 2015-05-03 NOTE — Telephone Encounter (Signed)
Second sent of meds sent in is correct meds. Should be on fenofibrate 134mg  daily, atorvastatin 10mg  daily, and lovaza 4cap daily.

## 2015-05-04 NOTE — Telephone Encounter (Signed)
[facility] notified.

## 2015-06-01 DIAGNOSIS — J322 Chronic ethmoidal sinusitis: Secondary | ICD-10-CM | POA: Diagnosis not present

## 2015-06-01 DIAGNOSIS — D169 Benign neoplasm of bone and articular cartilage, unspecified: Secondary | ICD-10-CM | POA: Diagnosis not present

## 2015-06-01 DIAGNOSIS — J329 Chronic sinusitis, unspecified: Secondary | ICD-10-CM | POA: Diagnosis not present

## 2015-06-01 DIAGNOSIS — H903 Sensorineural hearing loss, bilateral: Secondary | ICD-10-CM | POA: Diagnosis not present

## 2015-06-01 DIAGNOSIS — Z6826 Body mass index (BMI) 26.0-26.9, adult: Secondary | ICD-10-CM | POA: Diagnosis not present

## 2015-08-12 DIAGNOSIS — Z01818 Encounter for other preprocedural examination: Secondary | ICD-10-CM | POA: Diagnosis not present

## 2015-08-16 DIAGNOSIS — D169 Benign neoplasm of bone and articular cartilage, unspecified: Secondary | ICD-10-CM | POA: Diagnosis not present

## 2015-08-16 DIAGNOSIS — E7800 Pure hypercholesterolemia, unspecified: Secondary | ICD-10-CM | POA: Diagnosis not present

## 2015-08-16 DIAGNOSIS — K219 Gastro-esophageal reflux disease without esophagitis: Secondary | ICD-10-CM | POA: Diagnosis not present

## 2015-08-16 DIAGNOSIS — C410 Malignant neoplasm of bones of skull and face: Secondary | ICD-10-CM | POA: Diagnosis not present

## 2015-08-16 DIAGNOSIS — J310 Chronic rhinitis: Secondary | ICD-10-CM | POA: Diagnosis not present

## 2015-08-16 DIAGNOSIS — D164 Benign neoplasm of bones of skull and face: Secondary | ICD-10-CM | POA: Diagnosis not present

## 2015-08-16 DIAGNOSIS — J329 Chronic sinusitis, unspecified: Secondary | ICD-10-CM | POA: Diagnosis not present

## 2015-08-17 DIAGNOSIS — J329 Chronic sinusitis, unspecified: Secondary | ICD-10-CM | POA: Diagnosis not present

## 2015-08-17 DIAGNOSIS — E7800 Pure hypercholesterolemia, unspecified: Secondary | ICD-10-CM | POA: Diagnosis not present

## 2015-08-17 DIAGNOSIS — K219 Gastro-esophageal reflux disease without esophagitis: Secondary | ICD-10-CM | POA: Diagnosis not present

## 2015-08-17 DIAGNOSIS — D164 Benign neoplasm of bones of skull and face: Secondary | ICD-10-CM | POA: Diagnosis not present

## 2015-08-18 ENCOUNTER — Encounter: Payer: Self-pay | Admitting: Family Medicine

## 2015-08-20 DIAGNOSIS — R918 Other nonspecific abnormal finding of lung field: Secondary | ICD-10-CM

## 2015-08-20 HISTORY — DX: Other nonspecific abnormal finding of lung field: R91.8

## 2015-08-24 DIAGNOSIS — J329 Chronic sinusitis, unspecified: Secondary | ICD-10-CM | POA: Diagnosis not present

## 2015-08-31 DIAGNOSIS — R918 Other nonspecific abnormal finding of lung field: Secondary | ICD-10-CM | POA: Diagnosis not present

## 2015-09-06 ENCOUNTER — Encounter: Payer: Self-pay | Admitting: Family Medicine

## 2015-09-07 ENCOUNTER — Encounter: Payer: Self-pay | Admitting: Family Medicine

## 2015-09-09 ENCOUNTER — Encounter: Payer: Self-pay | Admitting: Family Medicine

## 2015-09-09 ENCOUNTER — Ambulatory Visit (INDEPENDENT_AMBULATORY_CARE_PROVIDER_SITE_OTHER): Payer: BLUE CROSS/BLUE SHIELD | Admitting: Family Medicine

## 2015-09-09 VITALS — BP 130/80 | HR 80 | Temp 97.9°F | Wt 213.0 lb

## 2015-09-09 DIAGNOSIS — R918 Other nonspecific abnormal finding of lung field: Secondary | ICD-10-CM | POA: Diagnosis not present

## 2015-09-09 DIAGNOSIS — I2510 Atherosclerotic heart disease of native coronary artery without angina pectoris: Secondary | ICD-10-CM

## 2015-09-09 DIAGNOSIS — K760 Fatty (change of) liver, not elsewhere classified: Secondary | ICD-10-CM

## 2015-09-09 HISTORY — DX: Atherosclerotic heart disease of native coronary artery without angina pectoris: I25.10

## 2015-09-09 HISTORY — DX: Fatty (change of) liver, not elsewhere classified: K76.0

## 2015-09-09 NOTE — Progress Notes (Signed)
Pre visit review using our clinic review tool, if applicable. No additional management support is needed unless otherwise documented below in the visit note. 

## 2015-09-09 NOTE — Patient Instructions (Signed)
CT showed some small lung nodules - we will repeat CT in 1 year. Also showed fatty liver and some calcium in the heart arteries. We will continue to watch this as well.

## 2015-09-09 NOTE — Assessment & Plan Note (Signed)
Discussed with patient. Overall low risk. However he may have had exposure to other fumes at work. Will repeat CT in 1 year (08/2016). Pt agrees with plan.  Not high risk for TB. Consider PPD next visit.

## 2015-09-09 NOTE — Assessment & Plan Note (Signed)
Discussed fatty liver with patient, encouraged good glycemic, lipid and weight control.

## 2015-09-09 NOTE — Progress Notes (Signed)
BP 130/80 mmHg  Pulse 80  Temp(Src) 97.9 F (36.6 C) (Oral)  Wt 213 lb (96.616 kg)   CC: discuss CT scan results  Subjective:    Patient ID: Ethan Austin, male    DOB: [birth year], 60 y.o.   MRN: [id number]  HPI: Ethan Austin is a 60 y.o. male presenting on 09/09/2015 for Follow-up   Recent lung CT 08/31/2015 obtained by ENT Dr Mardee Postin at [facility] after CXR showed possible R lobe lung lesion. Recent sinus surgery 08/16/2015 for chronic sinusitis and osteoma.  Procedure(s) Performed:  1. Bilateral nasal endoscopy with frontal sinusotomy, (CPT B6072076).  2. Bilateral nasal endoscopy total ethmoidectomy (CPT 31255-50).  3. Bilateral sphenoid endoscopy with mucous membrane removal (CPT W3755313).  4. Bilateral maxillary endoscopy with mucous membrane removal (CPT K9583011).  5. 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 Stereotactic Computer assisted naviagtion, extradural (CPT C4992713)    Grew up in [place]. No known exposure to fungal infection, no significant travel to [place] or [place]. Non smoker. No fmhx lung cancer. No known exposure to asbestos or radon or uranium. He does work in [employer] parts and is exposed to some forms of fumes.   Denies productive cough, unexpected weight loss, fever, night sweats.   No known h/o PNA or lung infections besides "bad colds".   Relevant past medical, surgical, family and social history reviewed and updated as indicated. Interim medical history since our last visit reviewed. Allergies and medications reviewed and updated. Current Outpatient Prescriptions on File Prior to Visit  Medication Sig  . aspirin 81 MG tablet Take 81 mg by mouth daily.  Marland Kitchen atorvastatin (LIPITOR) 10 MG tablet Take 1 tablet (10 mg total) by mouth daily at 6 PM.  . fenofibrate micronized (LOFIBRA) 134 MG capsule Take 1 capsule (134 mg total) by mouth daily before breakfast.  . fluticasone (FLONASE) 50 MCG/ACT nasal spray Place 2  sprays into both nostrils daily. (Patient taking differently: Place 2 sprays into both nostrils 2 (two) times daily. )  . omega-3 acid ethyl esters (LOVAZA) 1 g capsule Take 4 capsules (4 g total) by mouth daily. (Patient taking differently: Take 2 g by mouth daily. )  . omeprazole (PRILOSEC) 20 MG capsule Take 1 capsule (20 mg total) by mouth daily.  . traMADol (ULTRAM) 50 MG tablet TAKE 1 TABLET BY MOUTH 2 TIMES A DAY AS NEEDED FOR PAIN  . naproxen (NAPROSYN) 500 MG tablet TAKE ONE TABLET TWICE A DAY X 5 DAYS THEN PRN PAIN, TAKE WITH FOOD (Patient not taking: Reported on 09/09/2015)   No current facility-administered medications on file prior to visit.    Review of Systems Per HPI unless specifically indicated in ROS section     Objective:    BP 130/80 mmHg  Pulse 80  Temp(Src) 97.9 F (36.6 C) (Oral)  Wt 213 lb (96.616 kg)  Wt Readings from Last 3 Encounters:  09/09/15 213 lb (96.616 kg)  05/02/15 214 lb 8 oz (97.297 kg)  03/21/15 209 lb (94.802 kg)    Physical Exam  Constitutional: He appears well-developed and well-nourished. No distress.  Psychiatric: He has a normal mood and affect.  Nursing note and vitals reviewed.   EXAM: CT Chest with contrast  DATE: 08/31/2015 10:41 AM INTERPRETATION LOCATION: [facility] CLINICAL INDICATION: 60 years old Male with -R91.1-Lesion of lung   COMPARISON: Chest radiographs dated 08/12/2015 TECHNIQUE: A spiral CT scan was obtained with IV contrast from the thoracic inlet  through the hemidiaphragms. Images were reconstructed in the axial plane.  Coronal and sagittal reformatted images of the chest were also provided for further evaluation of the lung parenchyma.  FINDINGS:  AIRWAYS, LUNGS, PLEURA:  Clear central airways.  No consolidation. Clustered nodules noted at the left lung apex (4:24). Additional 5 mm left apical nodule (4:33). 6 mm left lower lobe nodule (4:142). 4 mm left lower lobe nodule (4:122). Partially calcified, 4 mm right  upper lobe nodule (4:93). 5 mm right middle lobe nodule (4:15). Right lower lobe superior segment granuloma (4:67). Right upper lobe calcified subpleural granuloma (4:33). No pleural effusion.  MEDIASTINUM:  Coronary artery calcifications. Normal heart size. No pericardial effusion.  Normal caliber thoracic aorta.  No lymphadenopathy by size criteria.  IMAGED ABDOMEN: Hepatic steatosis. Density at the gallbladder neck likely represents a gallstone. No evidence of cholecystitis. Splenule. Otherwise, unremarkable upper abdomen.  SOFT TISSUES: Unremarkable.  BONES: Degenerative disc disease. Enostosis in the sternum (4:90). No suspicious osseous lesions.  IMPRESSION: Right upper lobe, subpleural, calcified granuloma likely represents the right apical nodule seen on prior chest radiographs.  Clustered left apical nodules, likely impacted distal airways.  Multiple other 3-5 mm pulmonary nodules bilaterally, indeterminate. Recommend follow-up as per the 2017 Fleischner Society guidelines ================================================== Multiple solid nodules size: < 6 mm -- low risk patients: no routine follow-up -- high risk patients: optional CT at 12 months  ** low risk patients: minimal or absent history of smoking and/or other known risk factors  ** high risk patients: history of smoking or of other known risk factors (e.g. first degree relative with lung cancer, or exposure to asbestos, radon, uranium)   Thereasa Distance, DP Naidich, et al. Guidelines for Management of Incidental Pulmonary Nodules Detected on CT Images: From the Fleischner Society 2017. Radiology 2017     Assessment & Plan:   Problem List Items Addressed This Visit    CAD (coronary artery disease)    Discussed with patient.       Hepatic steatosis    Discussed fatty liver with patient, encouraged good glycemic, lipid and weight control.       Pulmonary nodules/lesions, multiple - Primary    Discussed with  patient. Overall low risk. However he may have had exposure to other fumes at work. Will repeat CT in 1 year (08/2016). Pt agrees with plan.  Not high risk for TB. Consider PPD next visit.          Follow up plan: No Follow-up on file.  Ria Bush, MD

## 2015-09-09 NOTE — Assessment & Plan Note (Signed)
Discussed with patient

## 2015-09-21 DIAGNOSIS — Z09 Encounter for follow-up examination after completed treatment for conditions other than malignant neoplasm: Secondary | ICD-10-CM | POA: Diagnosis not present

## 2015-09-21 DIAGNOSIS — J329 Chronic sinusitis, unspecified: Secondary | ICD-10-CM | POA: Diagnosis not present

## 2015-12-07 ENCOUNTER — Encounter: Payer: Self-pay | Admitting: Family Medicine

## 2015-12-23 DIAGNOSIS — Z6827 Body mass index (BMI) 27.0-27.9, adult: Secondary | ICD-10-CM | POA: Diagnosis not present

## 2015-12-23 DIAGNOSIS — J310 Chronic rhinitis: Secondary | ICD-10-CM | POA: Diagnosis not present

## 2015-12-23 DIAGNOSIS — D169 Benign neoplasm of bone and articular cartilage, unspecified: Secondary | ICD-10-CM | POA: Diagnosis not present

## 2016-03-14 ENCOUNTER — Encounter: Payer: Self-pay | Admitting: Family Medicine

## 2016-04-20 DIAGNOSIS — J329 Chronic sinusitis, unspecified: Secondary | ICD-10-CM | POA: Diagnosis not present

## 2016-04-24 ENCOUNTER — Encounter: Payer: Self-pay | Admitting: Family Medicine

## 2016-04-24 ENCOUNTER — Ambulatory Visit (INDEPENDENT_AMBULATORY_CARE_PROVIDER_SITE_OTHER): Payer: BLUE CROSS/BLUE SHIELD | Admitting: Family Medicine

## 2016-04-24 ENCOUNTER — Telehealth: Payer: Self-pay

## 2016-04-24 VITALS — BP 120/60 | HR 74 | Temp 98.3°F | Ht 75.0 in | Wt 218.8 lb

## 2016-04-24 DIAGNOSIS — J101 Influenza due to other identified influenza virus with other respiratory manifestations: Secondary | ICD-10-CM | POA: Diagnosis not present

## 2016-04-24 DIAGNOSIS — R509 Fever, unspecified: Secondary | ICD-10-CM

## 2016-04-24 LAB — POC INFLUENZA A&B (BINAX/QUICKVUE)
Influenza A, POC: NEGATIVE
Influenza B, POC: POSITIVE — AB

## 2016-04-24 MED ORDER — OSELTAMIVIR PHOSPHATE 75 MG PO CAPS: 75.0000 mg | ORAL_CAPSULE | Freq: Two times a day (BID) | ORAL | 0 refills | Status: DC

## 2016-04-24 NOTE — Telephone Encounter (Signed)
PLEASE NOTE: All timestamps contained within this report are represented as [place] Standard Time. CONFIDENTIALTY NOTICE: This fax transmission is intended only for the addressee. It contains information that is legally privileged, confidential or otherwise protected from use or disclosure. If you are not the intended recipient, you are strictly prohibited from reviewing, disclosing, copying using or disseminating any of this information or taking any action in reliance on or regarding this information. If you have received this fax in error, please notify us immediately by telephone so that we can arrange for its return to [place]. Phone: [phone], Toll-Free: [phone], Fax: [phone] Page: 1 of 1 Call Id: [id number] [place] Night - Client Nonclinical Telephone Record [place] Night - Client Client Site [place] Physician Ria Bush - MD Contact Type Call Who Is Calling Patient / Member / Family / Caregiver Caller Name [place] Phone Number [phone] or [phone] Patient Name Ethan Austin Call Type Message Only Information Provided Reason for Call Request to Schedule Office Appointment Initial Comment Caller States has fever wants appt today, message only no triage Additional Comment Call Closed By: Almon Register Transaction Date/Time: 04/24/2016 6:35:21 AM (ET)

## 2016-04-24 NOTE — Progress Notes (Signed)
Pre visit review using our clinic review tool, if applicable. No additional management support is needed unless otherwise documented below in the visit note. 

## 2016-04-24 NOTE — Progress Notes (Signed)
   Subjective:    Patient ID: Ethan Austin, male    DOB: 02-18-1956, 61 y.o.   MRN: [id number]  Fever   This is a new problem. The current episode started in the past 7 days ( 2-3 days). The problem has been gradually worsening. The maximum temperature noted was 101 to 101.9 F. The temperature was taken using an oral thermometer. Associated symptoms include coughing, muscle aches and a sore throat. Pertinent negatives include no congestion or ear pain. Associated symptoms comments: Dry cough, or clear mucus . He has tried acetaminophen (sinus rinse twice a day) for the symptoms. The treatment provided mild relief.  Risk factors: sick contacts   Cough  Associated symptoms include a fever and a sore throat. Pertinent negatives include no ear pain.    Flu at work  Blood pressure 120/60, pulse 74, temperature 98.3 F (36.8 C), temperature source Oral, height 6\' 3"  (1.905 m), weight 218 lb 12 oz (99.2 kg).  Hx of chronic sinusitis, CAD No nonsmoker  Review of Systems  Constitutional: Positive for fever.  HENT: Positive for sore throat. Negative for congestion and ear pain.   Respiratory: Positive for cough.        Objective:   Physical Exam  Constitutional: Vital signs are normal. He appears well-developed and well-nourished.  Non-toxic appearance. He does not appear ill. No distress.  HENT:  Head: Normocephalic and atraumatic.  Right Ear: Hearing, external ear and ear canal normal. No tenderness. No foreign bodies. Tympanic membrane is not retracted and not bulging.  Left Ear: Hearing, external ear and ear canal normal. No tenderness. No foreign bodies. Tympanic membrane is not retracted and not bulging. A middle ear effusion is present.  Nose: Nose normal. No mucosal edema or rhinorrhea. Right sinus exhibits no maxillary sinus tenderness and no frontal sinus tenderness. Left sinus exhibits no maxillary sinus tenderness and no frontal sinus tenderness.  Mouth/Throat: Uvula is  midline, oropharynx is clear and moist and mucous membranes are normal. Normal dentition. No dental caries. No oropharyngeal exudate or tonsillar abscesses.  Eyes: Conjunctivae, EOM and lids are normal. Pupils are equal, round, and reactive to light. Lids are everted and swept, no foreign bodies found.  Neck: Trachea normal, normal range of motion and phonation normal. Neck supple. Carotid bruit is not present. No thyroid mass and no thyromegaly present.  Cardiovascular: Normal rate, regular rhythm, S1 normal, S2 normal, normal heart sounds, intact distal pulses and normal pulses.  Exam reveals no gallop.   No murmur heard. Pulmonary/Chest: Effort normal and breath sounds normal. No respiratory distress. He has no wheezes. He has no rhonchi. He has no rales.  Abdominal: Soft. Normal appearance and bowel sounds are normal. There is no hepatosplenomegaly. There is no tenderness. There is no rebound, no guarding and no CVA tenderness. No hernia.  Neurological: He is alert. He has normal reflexes.  Skin: Skin is warm, dry and intact. No rash noted.  Psychiatric: He has a normal mood and affect. His speech is normal and behavior is normal. Judgment normal.          Assessment & Plan:

## 2016-04-24 NOTE — Telephone Encounter (Signed)
Pt has appt with Dr Diona Browner on 04/24/16 at 10:45.

## 2016-04-24 NOTE — Patient Instructions (Addendum)
Rest, fluids. Start and complete tamiflu for influenza B. Use nasal saline and fluticasone for nasal symptoms and to help left ear drain. Do not return to work until 24 hour after fever resolved on no antipyretics.

## 2016-04-24 NOTE — Assessment & Plan Note (Signed)
Discussed symptomatic care.  Hydration, rest. Call if SOB, cough worsening or prolongued fever. Reviewed flu timeline. Given CAD history and age we decided to use of tamiflu. Pt agreed. Discussed family prophylaxisHe was advised to not return to work until 24 hour after fever resolved on no antipyretics.

## 2016-04-26 ENCOUNTER — Encounter: Payer: Self-pay | Admitting: Family Medicine

## 2016-04-27 MED ORDER — AMOXICILLIN 500 MG PO CAPS: 1000.0000 mg | ORAL_CAPSULE | Freq: Two times a day (BID) | ORAL | 0 refills | Status: DC

## 2016-05-02 ENCOUNTER — Other Ambulatory Visit: Payer: Self-pay | Admitting: Family Medicine

## 2016-05-02 DIAGNOSIS — N401 Enlarged prostate with lower urinary tract symptoms: Secondary | ICD-10-CM

## 2016-05-02 DIAGNOSIS — R351 Nocturia: Principal | ICD-10-CM

## 2016-05-02 DIAGNOSIS — Z1159 Encounter for screening for other viral diseases: Secondary | ICD-10-CM

## 2016-05-02 DIAGNOSIS — E785 Hyperlipidemia, unspecified: Secondary | ICD-10-CM

## 2016-05-03 ENCOUNTER — Other Ambulatory Visit (INDEPENDENT_AMBULATORY_CARE_PROVIDER_SITE_OTHER): Payer: BLUE CROSS/BLUE SHIELD

## 2016-05-03 DIAGNOSIS — Z1159 Encounter for screening for other viral diseases: Secondary | ICD-10-CM

## 2016-05-03 DIAGNOSIS — R351 Nocturia: Secondary | ICD-10-CM | POA: Diagnosis not present

## 2016-05-03 DIAGNOSIS — E785 Hyperlipidemia, unspecified: Secondary | ICD-10-CM

## 2016-05-03 DIAGNOSIS — N401 Enlarged prostate with lower urinary tract symptoms: Secondary | ICD-10-CM | POA: Diagnosis not present

## 2016-05-04 LAB — COMPREHENSIVE METABOLIC PANEL
ALT: 41 U/L (ref 0–53)
AST: 25 U/L (ref 0–37)
Albumin: 4.4 g/dL (ref 3.5–5.2)
Alkaline Phosphatase: 53 U/L (ref 39–117)
BUN: 22 mg/dL (ref 6–23)
CHLORIDE: 102 meq/L (ref 96–112)
CO2: 30 meq/L (ref 19–32)
Calcium: 9.9 mg/dL (ref 8.4–10.5)
Creatinine, Ser: 1.26 mg/dL (ref 0.40–1.50)
GFR: 61.65 mL/min (ref >60.00)
GLUCOSE: 89 mg/dL (ref 70–99)
POTASSIUM: 4.2 meq/L (ref 3.5–5.1)
Sodium: 139 mEq/L (ref 135–145)
Total Bilirubin: 0.7 mg/dL (ref 0.2–1.2)
Total Protein: 6.6 g/dL (ref 6.0–8.3)

## 2016-05-04 LAB — LIPID PANEL
CHOL/HDL RATIO: 5
Cholesterol: 147 mg/dL (ref 0–200)
HDL: 29.4 mg/dL — AB (ref >39.00)
LDL CALC: 87 mg/dL (ref 0–99)
NonHDL: 117.33
TRIGLYCERIDES: 153 mg/dL — AB (ref 0.0–149.0)
VLDL: 30.6 mg/dL (ref 0.0–40.0)

## 2016-05-04 LAB — PSA: PSA: 1.12 ng/mL (ref 0.10–4.00)

## 2016-05-04 LAB — HEPATITIS C ANTIBODY: HCV Ab: NEGATIVE

## 2016-05-09 ENCOUNTER — Encounter: Payer: Self-pay | Admitting: Family Medicine

## 2016-05-10 ENCOUNTER — Encounter: Payer: Self-pay | Admitting: Family Medicine

## 2016-05-10 ENCOUNTER — Ambulatory Visit (INDEPENDENT_AMBULATORY_CARE_PROVIDER_SITE_OTHER): Payer: BLUE CROSS/BLUE SHIELD | Admitting: Family Medicine

## 2016-05-10 VITALS — BP 124/70 | HR 72 | Temp 98.1°F | Ht 75.0 in | Wt 216.8 lb

## 2016-05-10 DIAGNOSIS — I2510 Atherosclerotic heart disease of native coronary artery without angina pectoris: Secondary | ICD-10-CM | POA: Diagnosis not present

## 2016-05-10 DIAGNOSIS — R918 Other nonspecific abnormal finding of lung field: Secondary | ICD-10-CM

## 2016-05-10 DIAGNOSIS — Z0000 Encounter for general adult medical examination without abnormal findings: Secondary | ICD-10-CM

## 2016-05-10 DIAGNOSIS — I341 Nonrheumatic mitral (valve) prolapse: Secondary | ICD-10-CM

## 2016-05-10 DIAGNOSIS — J329 Chronic sinusitis, unspecified: Secondary | ICD-10-CM | POA: Diagnosis not present

## 2016-05-10 DIAGNOSIS — E782 Mixed hyperlipidemia: Secondary | ICD-10-CM

## 2016-05-10 DIAGNOSIS — N401 Enlarged prostate with lower urinary tract symptoms: Secondary | ICD-10-CM | POA: Diagnosis not present

## 2016-05-10 DIAGNOSIS — M7712 Lateral epicondylitis, left elbow: Secondary | ICD-10-CM | POA: Diagnosis not present

## 2016-05-10 DIAGNOSIS — R351 Nocturia: Secondary | ICD-10-CM | POA: Diagnosis not present

## 2016-05-10 DIAGNOSIS — K760 Fatty (change of) liver, not elsewhere classified: Secondary | ICD-10-CM

## 2016-05-10 DIAGNOSIS — Z1211 Encounter for screening for malignant neoplasm of colon: Secondary | ICD-10-CM | POA: Diagnosis not present

## 2016-05-10 DIAGNOSIS — R61 Generalized hyperhidrosis: Secondary | ICD-10-CM

## 2016-05-10 NOTE — Assessment & Plan Note (Signed)
New, discussed with patient.  rec L forearm rest, will mail exercises from [facility] pt advisor.  Pt states he has tennis elbow strap at home.

## 2016-05-10 NOTE — Assessment & Plan Note (Signed)
Preventative protocols reviewed and updated unless pt declined. Discussed healthy diet and lifestyle.  

## 2016-05-10 NOTE — Assessment & Plan Note (Signed)
Chronic, stable. Adequate readings. Continue lipitor, fibrate, lovaza.

## 2016-05-10 NOTE — Assessment & Plan Note (Signed)
Benign DRE/PSA today. Continue to monitor.

## 2016-05-10 NOTE — Assessment & Plan Note (Signed)
LFTs normal

## 2016-05-10 NOTE — Patient Instructions (Addendum)
We will repeat chest CT in this summer  We will set you up for repeat colonoscopy with Dr Roselyn Reef. We will check gold TB screening test - labs today. Return as needed or in 1 year for next physical.  You have left tennis elbow - avoid repetitive arm movements. Do exercises provided today.   Health Maintenance, Male A healthy lifestyle and preventive care is important for your health and wellness. Ask your health care provider about what schedule of regular examinations is right for you. What should I know about weight and diet?  Eat a Healthy Diet  Eat plenty of vegetables, fruits, whole grains, low-fat dairy products, and lean protein.  Do not eat a lot of foods high in solid fats, added sugars, or salt. Maintain a Healthy Weight  Regular exercise can help you achieve or maintain a healthy weight. You should:  Do at least 150 minutes of exercise each week. The exercise should increase your heart rate and make you sweat (moderate-intensity exercise).  Do strength-training exercises at least twice a week. Watch Your Levels of Cholesterol and Blood Lipids  Have your blood tested for lipids and cholesterol every 5 years starting at 62 years of age. If you are at high risk for heart disease, you should start having your blood tested when you are 62 years old. You may need to have your cholesterol levels checked more often if:  Your lipid or cholesterol levels are high.  You are older than 62 years of age.  You are at high risk for heart disease. What should I know about cancer screening? Many types of cancers can be detected early and may often be prevented. Lung Cancer  You should be screened every year for lung cancer if:  You are a current smoker who has smoked for at least 30 years.  You are a former smoker who has quit within the past 15 years.  Talk to your health care provider about your screening options, when you should start screening, and how often you should be  screened. Colorectal Cancer  Routine colorectal cancer screening usually begins at 62 years of age and should be repeated every 5-10 years until you are 62 years old. You may need to be screened more often if early forms of precancerous polyps or small growths are found. Your health care provider may recommend screening at an earlier age if you have risk factors for colon cancer.  Your health care provider may recommend using home test kits to check for hidden blood in the stool.  A small camera at the end of a tube can be used to examine your colon (sigmoidoscopy or colonoscopy). This checks for the earliest forms of colorectal cancer. Prostate and Testicular Cancer  Depending on your age and overall health, your health care provider may do certain tests to screen for prostate and testicular cancer.  Talk to your health care provider about any symptoms or concerns you have about testicular or prostate cancer. Skin Cancer  Check your skin from head to toe regularly.  Tell your health care provider about any new moles or changes in moles, especially if:  There is a change in a mole's size, shape, or color.  You have a mole that is larger than a pencil eraser.  Always use sunscreen. Apply sunscreen liberally and repeat throughout the day.  Protect yourself by wearing long sleeves, pants, a wide-brimmed hat, and sunglasses when outside. What should I know about heart disease, diabetes, and high blood  pressure?  If you are 35-29 years of age, have your blood pressure checked every 3-5 years. If you are 52 years of age or older, have your blood pressure checked every year. You should have your blood pressure measured twice-once when you are at a hospital or clinic, and once when you are not at a hospital or clinic. Record the average of the two measurements. To check your blood pressure when you are not at a hospital or clinic, you can use:  An automated blood pressure machine at a  pharmacy.  A home blood pressure monitor.  Talk to your health care provider about your target blood pressure.  If you are between 49-33 years old, ask your health care provider if you should take aspirin to prevent heart disease.  Have regular diabetes screenings by checking your fasting blood sugar level.  If you are at a normal weight and have a low risk for diabetes, have this test once every three years after the age of 39.  If you are overweight and have a high risk for diabetes, consider being tested at a younger age or more often.  A one-time screening for abdominal aortic aneurysm (AAA) by ultrasound is recommended for men aged 21-75 years who are current or former smokers. What should I know about preventing infection? Hepatitis B  If you have a higher risk for hepatitis B, you should be screened for this virus. Talk with your health care provider to find out if you are at risk for hepatitis B infection. Hepatitis C  Blood testing is recommended for:  Everyone born from [birth year] through 1966.  Anyone with known risk factors for hepatitis C. Sexually Transmitted Diseases (STDs)  You should be screened each year for STDs including gonorrhea and chlamydia if:  You are sexually active and are younger than 62 years of age.  You are older than 62 years of age and your health care provider tells you that you are at risk for this type of infection.  Your sexual activity has changed since you were last screened and you are at an increased risk for chlamydia or gonorrhea. Ask your health care provider if you are at risk.  Talk with your health care provider about whether you are at high risk of being infected with HIV. Your health care provider may recommend a prescription medicine to help prevent HIV infection. What else can I do?  Schedule regular health, dental, and eye exams.  Stay current with your vaccines (immunizations).  Do not use any tobacco products, such as  cigarettes, chewing tobacco, and e-cigarettes. If you need help quitting, ask your health care provider.  Limit alcohol intake to no more than 2 drinks per day. One drink equals 12 ounces of beer, 5 ounces of wine, or 1 ounces of hard liquor.  Do not use street drugs.  Do not share needles.  Ask your health care provider for help if you need support or information about quitting drugs.  Tell your health care provider if you often feel depressed.  Tell your health care provider if you have ever been abused or do not feel safe at home. This information is not intended to replace advice given to you by your health care provider. Make sure you discuss any questions you have with your health care provider. Document Released: 08/04/2007 Document Revised: 10/05/2015 Document Reviewed: 11/09/2014 Elsevier Interactive Patient Education  2017 [employer].

## 2016-05-10 NOTE — Assessment & Plan Note (Addendum)
Reviewed with patient. Will update CT 08/2016. Will check quantiferon gold assay.

## 2016-05-10 NOTE — Assessment & Plan Note (Signed)
asxs. Continue statin, aspirin.

## 2016-05-10 NOTE — Assessment & Plan Note (Signed)
s/p sinus surgery 2017. Continues to follow with ENT Dr Carlis Abbott.

## 2016-05-10 NOTE — Assessment & Plan Note (Signed)
Not appreciated today.  

## 2016-05-10 NOTE — Progress Notes (Signed)
Pre visit review using our clinic review tool, if applicable. No additional management support is needed unless otherwise documented below in the visit note. 

## 2016-05-10 NOTE — Progress Notes (Signed)
BP 124/70   Pulse 72   Temp 98.1 F (36.7 C) (Oral)   Ht 6\' 3"  (1.905 m)   Wt 216 lb 12 oz (98.3 kg)   BMI 27.09 kg/m    CC: CPE Subjective:    Patient ID: Ethan Austin, male    DOB: August 02, 1955, 61 y.o.   MRN: [id number]  HPI: HEBERT DOOLING is a 61 y.o. male presenting on 05/10/2016 for Annual Exam   Mult pulm nodules found on CT last summer - will be due for rpt CT 08/2016. Not high risk for TB - consider PPD. Endorses rare night sweats.   Some ongoing L lateral elbow pain that radiates to left shoulder. Noticed with lifting heavy equipment.   Had flu earlier this month, saw Dr Diona Browner. Improving residual cough.  Preventative: Colonoscopy 2008 WNL per patient, due. Requests return to Dr Allen Norris.  Prostate cancer screening - discussed, would like screening. Nocturia x1 with weakening of stream.  Flu yearly  Tdap - 2014  zostavax 2016.  Seat belt use discussed Sunscreen use discussed. No changing moles on skin. H/o BCC  Non smoker  Alcohol - none  Caffeine: 2 cups coffee in am, 1 tea at night Lives with wife and 2 children Occupation: Armed forces logistics/support/administrative officer at [employer]: some college Activity: [employer] with wife, no regular exercise Diet: good water, fruits/vegetables daily  Relevant past medical, surgical, family and social history reviewed and updated as indicated. Interim medical history since our last visit reviewed. Allergies and medications reviewed and updated. Outpatient Medications Prior to Visit  Medication Sig Dispense Refill  . aspirin 81 MG tablet Take 81 mg by mouth daily.    Marland Kitchen atorvastatin (LIPITOR) 10 MG tablet Take 1 tablet (10 mg total) by mouth daily at 6 PM. 90 tablet 3  . cetirizine (ZYRTEC) 10 MG tablet Take 10 mg by mouth 1 day or 1 dose.    . fenofibrate micronized (LOFIBRA) 134 MG capsule Take 1 capsule (134 mg total) by mouth daily before breakfast. 90 capsule 3  . naproxen (NAPROSYN) 500 MG tablet TAKE ONE TABLET TWICE A DAY X 5  DAYS THEN PRN PAIN, TAKE WITH FOOD 40 tablet 0  . omega-3 acid ethyl esters (LOVAZA) 1 g capsule Take 4 capsules (4 g total) by mouth daily. (Patient taking differently: Take 2 g by mouth daily. ) 360 capsule 3  . omeprazole (PRILOSEC) 20 MG capsule Take 1 capsule (20 mg total) by mouth daily. 90 capsule 3  . traMADol (ULTRAM) 50 MG tablet TAKE 1 TABLET BY MOUTH 2 TIMES A DAY AS NEEDED FOR PAIN 30 tablet 0  . amoxicillin (AMOXIL) 500 MG capsule Take 2 capsules (1,000 mg total) by mouth 2 (two) times daily. 40 capsule 0  . fluticasone (FLONASE) 50 MCG/ACT nasal spray Place 2 sprays into both nostrils daily. (Patient taking differently: Place 2 sprays into both nostrils 2 (two) times daily. ) 16 g 3  . oseltamivir (TAMIFLU) 75 MG capsule Take 1 capsule (75 mg total) by mouth 2 (two) times daily. 10 capsule 0   No facility-administered medications prior to visit.      Per HPI unless specifically indicated in ROS section below Review of Systems  Constitutional: Positive for fever (recent flu). Negative for activity change, appetite change, chills, fatigue and unexpected weight change.  HENT: Negative for hearing loss.   Eyes: Negative for visual disturbance.  Respiratory: Positive for cough (recent flu). Negative for chest tightness, shortness of breath  and wheezing.   Cardiovascular: Negative for chest pain, palpitations and leg swelling.  Gastrointestinal: Negative for abdominal distention, abdominal pain, blood in stool, constipation, diarrhea, nausea and vomiting.  Genitourinary: Negative for difficulty urinating and hematuria.  Musculoskeletal: Negative for arthralgias, myalgias and neck pain.  Skin: Negative for rash.  Neurological: Negative for dizziness, seizures, syncope and headaches.  Hematological: Negative for adenopathy. Does not bruise/bleed easily.  Psychiatric/Behavioral: Negative for dysphoric mood. The patient is not nervous/anxious.        Objective:    BP 124/70    Pulse 72   Temp 98.1 F (36.7 C) (Oral)   Ht 6\' 3"  (1.905 m)   Wt 216 lb 12 oz (98.3 kg)   BMI 27.09 kg/m   Wt Readings from Last 3 Encounters:  05/10/16 216 lb 12 oz (98.3 kg)  04/24/16 218 lb 12 oz (99.2 kg)  09/09/15 213 lb (96.6 kg)    Physical Exam  Constitutional: He is oriented to person, place, and time. He appears well-developed and well-nourished. No distress.  HENT:  Head: Normocephalic and atraumatic.  Right Ear: Hearing, tympanic membrane, external ear and ear canal normal.  Left Ear: Hearing, tympanic membrane, external ear and ear canal normal.  Nose: Nose normal.  Mouth/Throat: Uvula is midline, oropharynx is clear and moist and mucous membranes are normal. No oropharyngeal exudate, posterior oropharyngeal edema or posterior oropharyngeal erythema.  Eyes: Conjunctivae and EOM are normal. Pupils are equal, round, and reactive to light. No scleral icterus.  Neck: Normal range of motion. Neck supple. No thyromegaly present.  Cardiovascular: Normal rate, regular rhythm, normal heart sounds and intact distal pulses.   No murmur heard. Pulses:      Radial pulses are 2+ on the right side, and 2+ on the left side.  Pulmonary/Chest: Effort normal and breath sounds normal. No respiratory distress. He has no wheezes. He has no rales.  Abdominal: Soft. Bowel sounds are normal. He exhibits no distension and no mass. There is no tenderness. There is no rebound and no guarding.  Genitourinary: Rectum normal and prostate normal. Rectal exam shows no external hemorrhoid, no internal hemorrhoid, no fissure, no mass, no tenderness and anal tone normal. Prostate is not enlarged (20gm) and not tender.  Musculoskeletal: Normal range of motion. He exhibits no edema.  Tenderness at left lateral epicondyle, pain with strength testing against resistance wrist extenders FROM elbows and shoulders  Lymphadenopathy:    He has no cervical adenopathy.  Neurological: He is alert and oriented to  person, place, and time.  CN grossly intact, station and gait intact  Skin: Skin is warm and dry. No rash noted.  Psychiatric: He has a normal mood and affect. His behavior is normal. Judgment and thought content normal.  Nursing note and vitals reviewed.  Results for orders placed or performed in visit on 05/03/16  Comprehensive metabolic panel  Result Value Ref Range   Sodium 139 135 - 145 mEq/L   Potassium 4.2 3.5 - 5.1 mEq/L   Chloride 102 96 - 112 mEq/L   CO2 30 19 - 32 mEq/L   Glucose, Bld 89 70 - 99 mg/dL   BUN 22 6 - 23 mg/dL   Creatinine, Ser 1.26 0.40 - 1.50 mg/dL   Total Bilirubin 0.7 0.2 - 1.2 mg/dL   Alkaline Phosphatase 53 39 - 117 U/L   AST 25 0 - 37 U/L   ALT 41 0 - 53 U/L   Total Protein 6.6 6.0 - 8.3 g/dL  Albumin 4.4 3.5 - 5.2 g/dL   Calcium 9.9 8.4 - 10.5 mg/dL   GFR 61.65 >60.00 mL/min  PSA  Result Value Ref Range   PSA 1.12 0.10 - 4.00 ng/mL  Lipid panel  Result Value Ref Range   Cholesterol 147 0 - 200 mg/dL   Triglycerides 153.0 (H) 0.0 - 149.0 mg/dL   HDL 29.40 (L) >39.00 mg/dL   VLDL 30.6 0.0 - 40.0 mg/dL   LDL Cholesterol 87 0 - 99 mg/dL   Total CHOL/HDL Ratio 5    NonHDL 117.33   Hepatitis C antibody  Result Value Ref Range   HCV Ab NEGATIVE NEGATIVE      Assessment & Plan:   Problem List Items Addressed This Visit    Benign prostatic hyperplasia with nocturia    Benign DRE/PSA today. Continue to monitor.       CAD (coronary artery disease)    asxs. Continue statin, aspirin.       Health care maintenance - Primary    Preventative protocols reviewed and updated unless pt declined. Discussed healthy diet and lifestyle.       Hepatic steatosis    LFTs normal.       HLD (hyperlipidemia)    Chronic, stable. Adequate readings. Continue lipitor, fibrate, lovaza.       Left tennis elbow    New, discussed with patient.  rec L forearm rest, will mail exercises from [facility] pt advisor.  Pt states he has tennis elbow strap at home.        MVP (mitral valve prolapse)    Not appreciated today.       Pulmonary nodules/lesions, multiple    Reviewed with patient. Will update CT 08/2016. Will check quantiferon gold assay.       Relevant Orders   Quantiferon tb gold assay   Sinusitis, chronic    s/p sinus surgery 2017. Continues to follow with ENT Dr Carlis Abbott.      Relevant Medications   fluticasone (FLONASE) 50 MCG/ACT nasal spray    Other Visit Diagnoses    Special screening for malignant neoplasms, colon       Relevant Orders   Ambulatory referral to Gastroenterology   Night sweats       Relevant Orders   Quantiferon tb gold assay       Follow up plan: Return in about 1 year (around 05/10/2017) for annual exam, prior fasting for blood work.  Ria Bush, MD

## 2016-05-14 ENCOUNTER — Other Ambulatory Visit: Payer: Self-pay | Admitting: Family Medicine

## 2016-05-14 LAB — QUANTIFERON TB GOLD ASSAY (BLOOD)
INTERFERON GAMMA RELEASE ASSAY: NEGATIVE
QUANTIFERON NIL VALUE: 0.01 [IU]/mL
QUANTIFERON TB AG MINUS NIL: 0 [IU]/mL

## 2016-06-04 ENCOUNTER — Telehealth: Payer: Self-pay | Admitting: Gastroenterology

## 2016-06-04 ENCOUNTER — Other Ambulatory Visit: Payer: Self-pay

## 2016-06-04 ENCOUNTER — Telehealth: Payer: Self-pay

## 2016-06-04 DIAGNOSIS — Z1211 Encounter for screening for malignant neoplasm of colon: Secondary | ICD-10-CM

## 2016-06-04 NOTE — Telephone Encounter (Signed)
Gastroenterology Pre-Procedure Review  Request Date: 08/07/16 Requesting Physician: Dr. Allen Norris  PATIENT REVIEW QUESTIONS: The patient responded to the following health history questions as indicated:    1. Are you having any GI issues? no 2. Do you have a personal history of Polyps? no 3. Do you have a family history of Colon Cancer or Polyps? no 4. Diabetes Mellitus? no 5. Joint replacements in the past 12 months?no 6. Major health problems in the past 3 months?no 7. Any artificial heart valves, MVP, or defibrillator?no    MEDICATIONS & ALLERGIES:    Patient reports the following regarding taking any anticoagulation/antiplatelet therapy:   Plavix, Coumadin, Eliquis, Xarelto, Lovenox, Pradaxa, Brilinta, or Effient? no Aspirin? yes (81mg )  Patient confirms/reports the following medications:  Current Outpatient Prescriptions  Medication Sig Dispense Refill  . aspirin 81 MG tablet Take 81 mg by mouth daily.    Marland Kitchen atorvastatin (LIPITOR) 10 MG tablet Take 1 tablet (10 mg total) by mouth daily at 6 PM. 90 tablet 3  . cetirizine (ZYRTEC) 10 MG tablet Take 10 mg by mouth 1 day or 1 dose.    . fenofibrate micronized (LOFIBRA) 134 MG capsule Take 1 capsule (134 mg total) by mouth daily before breakfast. 90 capsule 3  . fluticasone (FLONASE) 50 MCG/ACT nasal spray Place 2 sprays into both nostrils 2 (two) times daily.    . naproxen (NAPROSYN) 500 MG tablet TAKE ONE TABLET TWICE A DAY X 5 DAYS THEN PRN PAIN, TAKE WITH FOOD 40 tablet 0  . omega-3 acid ethyl esters (LOVAZA) 1 g capsule Take 4 capsules (4 g total) by mouth daily. (Patient taking differently: Take 2 g by mouth daily. ) 360 capsule 3  . omeprazole (PRILOSEC) 20 MG capsule TAKE 1 CAPSULE DAILY 90 capsule 3  . traMADol (ULTRAM) 50 MG tablet TAKE 1 TABLET BY MOUTH 2 TIMES A DAY AS NEEDED FOR PAIN 30 tablet 0   No current facility-administered medications for this visit.     Patient confirms/reports the following allergies:  Allergies   Allergen Reactions  . Sulfur Nausea Only  . Doxycycline Rash    No orders of the defined types were placed in this encounter.   AUTHORIZATION INFORMATION Primary Insurance: 1D#: Group #:  Secondary Insurance: 1D#: Group #:  SCHEDULE INFORMATION: Date: 6/19 Time: Location: [place]

## 2016-06-04 NOTE — Telephone Encounter (Signed)
Patient left a voice message that he received a letter to schedule his colonoscopy

## 2016-06-05 ENCOUNTER — Other Ambulatory Visit: Payer: Self-pay

## 2016-06-05 DIAGNOSIS — Z1211 Encounter for screening for malignant neoplasm of colon: Secondary | ICD-10-CM

## 2016-06-08 ENCOUNTER — Telehealth: Payer: Self-pay | Admitting: Gastroenterology

## 2016-06-08 NOTE — Telephone Encounter (Signed)
06/08/16 Spoke with Mardene Celeste at [facility] and NO prior [place] required for Colonoscopy [id number] / Z12.11 ref# [id number]

## 2016-07-23 ENCOUNTER — Telehealth: Payer: Self-pay | Admitting: Gastroenterology

## 2016-07-23 NOTE — Telephone Encounter (Signed)
Patient never received his prepping information.

## 2016-07-24 NOTE — Telephone Encounter (Signed)
Request for different bowel prep.  Pt stated that he would like to use the miralax/dulcolax method for bowel prep.  I advised him that this is not an approved bowel prep as it does not do a good job cleaning the colon.  I suggested Golytely instead. Directions given: Drink starting evening before every 20 mins 8 oz until bowels are clear.  He has agreed to Golytely Prep and understands the instructions provided.

## 2016-07-31 ENCOUNTER — Encounter: Payer: Self-pay | Admitting: *Deleted

## 2016-08-03 NOTE — Discharge Instructions (Signed)
General Anesthesia, Adult, Care After °These instructions provide you with information about caring for yourself after your procedure. Your health care provider may also give you more specific instructions. Your treatment has been planned according to current medical practices, but problems sometimes occur. Call your health care provider if you have any problems or questions after your procedure. °What can I expect after the procedure? °After the procedure, it is common to have: °· Vomiting. °· A sore throat. °· Mental slowness. ° °It is common to feel: °· Nauseous. °· Cold or shivery. °· Sleepy. °· Tired. °· Sore or achy, even in parts of your body where you did not have surgery. ° °Follow these instructions at home: °For at least 24 hours after the procedure: °· Do not: °? Participate in activities where you could fall or become injured. °? Drive. °? Use heavy machinery. °? Drink alcohol. °? Take sleeping pills or medicines that cause drowsiness. °? Make important decisions or sign legal documents. °? Take care of children on your own. °· Rest. °Eating and drinking °· If you vomit, drink water, juice, or soup when you can drink without vomiting. °· Drink enough fluid to keep your urine clear or pale yellow. °· Make sure you have little or no nausea before eating solid foods. °· Follow the diet recommended by your health care provider. °General instructions °· Have a responsible adult stay with you until you are awake and alert. °· Return to your normal activities as told by your health care provider. Ask your health care provider what activities are safe for you. °· Take over-the-counter and prescription medicines only as told by your health care provider. °· If you smoke, do not smoke without supervision. °· Keep all follow-up visits as told by your health care provider. This is important. °Contact a health care provider if: °· You continue to have nausea or vomiting at home, and medicines are not helpful. °· You  cannot drink fluids or start eating again. °· You cannot urinate after 8-12 hours. °· You develop a skin rash. °· You have fever. °· You have increasing redness at the site of your procedure. °Get help right away if: °· You have difficulty breathing. °· You have chest pain. °· You have unexpected bleeding. °· You feel that you are having a life-threatening or urgent problem. °This information is not intended to replace advice given to you by your health care provider. Make sure you discuss any questions you have with your health care provider. °Document Released: 05/14/2000 Document Revised: 07/11/2015 Document Reviewed: 01/20/2015 °Elsevier Interactive Patient Education © 2018 Elsevier Inc. ° °

## 2016-08-04 ENCOUNTER — Other Ambulatory Visit: Payer: Self-pay | Admitting: Family Medicine

## 2016-08-06 ENCOUNTER — Encounter
Admission: RE | Disposition: A | Discharge status: Home / Self Care | Payer: Self-pay | Source: Ambulatory Visit | Attending: Gastroenterology

## 2016-08-06 ENCOUNTER — Ambulatory Visit: Payer: BLUE CROSS/BLUE SHIELD | Admitting: Anesthesiology

## 2016-08-06 ENCOUNTER — Ambulatory Visit
Admission: RE | Admit: 2016-08-06 | Discharge: 2016-08-06 | Disposition: A | Discharge status: Home / Self Care | Payer: BLUE CROSS/BLUE SHIELD | Source: Ambulatory Visit | Attending: Gastroenterology | Admitting: Gastroenterology

## 2016-08-06 DIAGNOSIS — M1990 Unspecified osteoarthritis, unspecified site: Secondary | ICD-10-CM | POA: Insufficient documentation

## 2016-08-06 DIAGNOSIS — Z85828 Personal history of other malignant neoplasm of skin: Secondary | ICD-10-CM | POA: Insufficient documentation

## 2016-08-06 DIAGNOSIS — K635 Polyp of colon: Secondary | ICD-10-CM | POA: Diagnosis not present

## 2016-08-06 DIAGNOSIS — E785 Hyperlipidemia, unspecified: Secondary | ICD-10-CM | POA: Diagnosis not present

## 2016-08-06 DIAGNOSIS — I341 Nonrheumatic mitral (valve) prolapse: Secondary | ICD-10-CM | POA: Diagnosis not present

## 2016-08-06 DIAGNOSIS — Z1211 Encounter for screening for malignant neoplasm of colon: Secondary | ICD-10-CM | POA: Diagnosis not present

## 2016-08-06 DIAGNOSIS — Z7982 Long term (current) use of aspirin: Secondary | ICD-10-CM | POA: Insufficient documentation

## 2016-08-06 DIAGNOSIS — Z79899 Other long term (current) drug therapy: Secondary | ICD-10-CM | POA: Diagnosis not present

## 2016-08-06 DIAGNOSIS — D122 Benign neoplasm of ascending colon: Secondary | ICD-10-CM | POA: Diagnosis not present

## 2016-08-06 DIAGNOSIS — I2510 Atherosclerotic heart disease of native coronary artery without angina pectoris: Secondary | ICD-10-CM | POA: Diagnosis not present

## 2016-08-06 DIAGNOSIS — K219 Gastro-esophageal reflux disease without esophagitis: Secondary | ICD-10-CM | POA: Diagnosis not present

## 2016-08-06 HISTORY — DX: Injury of ulnar nerve at forearm level, unspecified arm, initial encounter: S54.00XA

## 2016-08-06 HISTORY — DX: Unspecified osteoarthritis, unspecified site: M19.90

## 2016-08-06 HISTORY — PX: POLYPECTOMY: SHX5525

## 2016-08-06 HISTORY — DX: Lateral epicondylitis, unspecified elbow: M77.10

## 2016-08-06 HISTORY — DX: Presence of dental prosthetic device (complete) (partial): Z97.2

## 2016-08-06 HISTORY — PX: COLONOSCOPY WITH PROPOFOL: SHX5780

## 2016-08-06 SURGERY — COLONOSCOPY WITH PROPOFOL
Anesthesia: General | Wound class: Contaminated

## 2016-08-06 MED ORDER — LACTATED RINGERS IV SOLN
INTRAVENOUS | Status: DC
  Administered 2016-08-06: 12:00:00 via INTRAVENOUS

## 2016-08-06 MED ORDER — STERILE WATER FOR IRRIGATION IR SOLN
Status: DC | PRN
  Administered 2016-08-06: 12:00:00

## 2016-08-06 MED ORDER — PROPOFOL 10 MG/ML IV BOLUS
INTRAVENOUS | Status: DC | PRN
  Administered 2016-08-06 (×6): 50 mg via INTRAVENOUS
  Administered 2016-08-06: 100 mg via INTRAVENOUS
  Administered 2016-08-06: 50 mg via INTRAVENOUS

## 2016-08-06 MED ORDER — LIDOCAINE HCL (CARDIAC) 20 MG/ML IV SOLN
INTRAVENOUS | Status: DC | PRN
  Administered 2016-08-06: 50 mg via INTRAVENOUS

## 2016-08-06 SURGICAL SUPPLY — 23 items

## 2016-08-06 NOTE — Transfer of Care (Signed)
Immediate Anesthesia Transfer of Care Note  Patient: Ethan Austin  Procedure(s) Performed: Procedure(s): COLONOSCOPY WITH PROPOFOL (N/A) POLYPECTOMY  Patient Location: PACU  Anesthesia Type: General  Level of Consciousness: awake, alert  and patient cooperative  Airway and Oxygen Therapy: Patient Spontanous Breathing and Patient connected to supplemental oxygen  Post-op Assessment: Post-op Vital signs reviewed, Patient's Cardiovascular Status Stable, Respiratory Function Stable, Patent Airway and No signs of Nausea or vomiting  Post-op Vital Signs: Reviewed and stable  Complications: No apparent anesthesia complications

## 2016-08-06 NOTE — Anesthesia Preprocedure Evaluation (Signed)
Anesthesia Evaluation  Patient identified by MRN, date of birth, ID band Patient awake    Reviewed: Allergy & Precautions, H&P , NPO status , Patient's Chart, lab work & pertinent test results  Airway Mallampati: II  TM Distance: >3 FB Neck ROM: full    Dental no notable dental hx.    Pulmonary neg pulmonary ROS,    Pulmonary exam normal        Cardiovascular negative cardio ROS Normal cardiovascular exam     Neuro/Psych    GI/Hepatic Neg liver ROS, Medicated,  Endo/Other  negative endocrine ROS  Renal/GU negative Renal ROS     Musculoskeletal   Abdominal   Peds  Hematology negative hematology ROS (+)   Anesthesia Other Findings   Reproductive/Obstetrics                             Anesthesia Physical Anesthesia Plan  ASA: II  Anesthesia Plan: General   Post-op Pain Management:    Induction:   PONV Risk Score and Plan:   Airway Management Planned:   Additional Equipment:   Intra-op Plan:   Post-operative Plan:   Informed Consent: I have reviewed the patients History and Physical, chart, labs and discussed the procedure including the risks, benefits and alternatives for the proposed anesthesia with the patient or authorized representative who has indicated his/her understanding and acceptance.     Plan Discussed with:   Anesthesia Plan Comments:         Anesthesia Quick Evaluation

## 2016-08-06 NOTE — Op Note (Signed)
[facility] Patient Name: Ethan Austin Procedure Date: 08/06/2016 12:24 PM MRN: [id number] Account #: [id number] Date of Birth: 27-Feb-1955 Admit Type: Outpatient Age: 61 Room: [facility] OR ROOM 01 Gender: Male Note Status: Finalized Procedure:            Colonoscopy Indications:          Screening for colorectal malignant neoplasm Providers:            [name] MD, MD Referring MD:         Ria Bush (Referring MD) Medicines:            Propofol per Anesthesia Complications:        No immediate complications. Procedure:            Pre-Anesthesia Assessment:                       - Prior to the procedure, a History and Physical was                        performed, and patient medications and allergies were                        reviewed. The patient's tolerance of previous                        anesthesia was also reviewed. The risks and benefits of                        the procedure and the sedation options and risks were                        discussed with the patient. All questions were                        answered, and informed consent was obtained. Prior                        Anticoagulants: The patient has taken no previous                        anticoagulant or antiplatelet agents. ASA Grade                        Assessment: II - A patient with mild systemic disease.                        After reviewing the risks and benefits, the patient was                        deemed in satisfactory condition to undergo the                        procedure.                       After obtaining informed consent, the colonoscope was                        passed under direct vision. Throughout the procedure,  the patient's blood pressure, pulse, and oxygen                        saturations were monitored continuously. The [place] (S#: [id number]) was introduced through                         the anus and advanced to the the cecum, identified by                        appendiceal orifice and ileocecal valve. The                        colonoscopy was performed without difficulty. The                        patient tolerated the procedure well. The quality of                        the bowel preparation was excellent. Findings:      The perianal and digital rectal examinations were normal.      Two sessile polyps were found in the ascending colon. The polyps were 6       to 10 mm in size. These polyps were removed with a cold snare. Resection       and retrieval were complete. Impression:           - Two 6 to 10 mm polyps in the ascending colon, removed                        with a cold snare. Resected and retrieved. Recommendation:       - Discharge patient to home.                       - Resume previous diet.                       - Continue present medications.                       - Await pathology results.                       - Repeat colonoscopy in 5 years if polyp adenoma and 10                        years if hyperplastic Procedure Code(s):    --- Professional ---                       [id number], Colonoscopy, flexible; with removal of tumor(s),                        polyp(s), or other lesion(s) by snare technique Diagnosis Code(s):    --- Professional ---                       Z12.11, Encounter for screening for malignant neoplasm  of colon                       D12.2, Benign neoplasm of ascending colon CPT copyright 2016 American Medical Association. All rights reserved. The codes documented in this report are preliminary and upon coder review may  be revised to meet current compliance requirements. [name] MD, MD 08/06/2016 12:47:18 PM This report has been signed electronically. Number of Addenda: 0 Note Initiated On: 08/06/2016 12:24 PM Scope Withdrawal Time: 0 hours 9 minutes 19 seconds  Total Procedure  Duration: 0 hours 16 minutes 24 seconds       [facility]

## 2016-08-06 NOTE — Anesthesia Postprocedure Evaluation (Signed)
Anesthesia Post Note  Patient: Ethan Austin  Procedure(s) Performed: Procedure(s) (LRB): COLONOSCOPY WITH PROPOFOL (N/A) POLYPECTOMY  Patient location during evaluation: PACU Anesthesia Type: General Level of consciousness: awake and alert Pain management: pain level controlled Vital Signs Assessment: post-procedure vital signs reviewed and stable Respiratory status: spontaneous breathing Cardiovascular status: blood pressure returned to baseline Postop Assessment: no headache Anesthetic complications: no    Jaci Standard, III,  Jwan Hornbaker D

## 2016-08-06 NOTE — H&P (Signed)
Ethan Lame, MD [facility] [address], [place], [place] [address] Phone: [phone] Fax : [phone]  Primary Care Physician:  Ria Bush, MD Primary Gastroenterologist:  Dr. Allen Norris  Pre-Procedure History & Physical: HPI:  Ethan Austin is a 61 y.o. male is here for a screening colonoscopy.   Past Medical History:  Diagnosis Date  . Allergic rhinitis   . Arthritis    back  . BCC (basal cell carcinoma of skin)    on face and nose  . CAD (coronary artery disease) 09/09/2015   By CT 08/2015   . Dental bridge present    lower right.  also crowns and implants.  Marland Kitchen GERD (gastroesophageal reflux disease)   . H. pylori infection   . Hepatic steatosis 09/09/2015   By CT 08/2015   . History of chicken pox   . HLD (hyperlipidemia)   . Migraine <2005   resolved after sinus surgery, now occasional TTH  . MVP (mitral valve prolapse)   . Pulmonary nodules/lesions, multiple 08/2015   by CT, all <71mm  . Tennis elbow    left  . Ulnar nerve damage    right    Past Surgical History:  Procedure Laterality Date  . COLONOSCOPY  2008   rpt 10 yrs  . DENTAL SURGERY  1978   4 teeth removed  . ESOPHAGOGASTRODUODENOSCOPY  1999   stricture dilated  . NASAL SINUS SURGERY  2005   Dr Carlis Abbott  . NASAL SINUS SURGERY Bilateral 2017   chronic rhinosinusitis, L frontal skull base osteoma resected as well ([facility] at [facility])    Prior to Admission medications   Medication Sig Start Date End Date Taking? Authorizing Provider  aspirin 81 MG tablet Take 81 mg by mouth daily.   Yes [provider]  atorvastatin (LIPITOR) 10 MG tablet TAKE 1 TABLET DAILY AT 6:00 P.M. 08/06/16  Yes Ria Bush, MD  cetirizine (ZYRTEC) 10 MG tablet Take 10 mg by mouth 1 day or 1 dose.   Yes [provider]  fenofibrate micronized (LOFIBRA) 134 MG capsule TAKE 1 CAPSULE DAILY BEFORE BREAKFAST 08/06/16  Yes Ria Bush, MD  fluticasone [facility]) 50 MCG/ACT nasal spray Place 2  sprays into both nostrils 2 (two) times daily.   Yes [provider]  omega-3 acid ethyl esters (LOVAZA) 1 g capsule Take 4 capsules (4 g total) by mouth daily. Patient taking differently: Take 2 g by mouth daily.  05/02/15  Yes Ria Bush, MD  omeprazole (PRILOSEC) 20 MG capsule TAKE 1 CAPSULE DAILY 05/14/16  Yes Ria Bush, MD  traMADol (ULTRAM) 50 MG tablet TAKE 1 TABLET BY MOUTH 2 TIMES A DAY AS NEEDED FOR PAIN 03/08/15  Yes Ria Bush, MD    Allergies as of 06/05/2016 - Review Complete 05/10/2016  Allergen Reaction Noted  . Sulfur Nausea Only 04/30/2013  . Doxycycline Rash 04/08/2015    Family History  Problem Relation Age of Onset  . Alzheimer's disease Mother   . Stroke Mother   . Hypertension Mother   . Cancer Sister        breast  . Diabetes Maternal Grandfather   . Diabetes Maternal Grandmother   . CAD Father        mild MI    Social History   Social History  . Marital status: Married    Spouse name: N/A  . Number of children: N/A  . Years of education: N/A   Occupational History  . Not on file.  Social History Main Topics  . Smoking status: Never Smoker  . Smokeless tobacco: Never Used  . Alcohol use No  . Drug use: No  . Sexual activity: Not on file   Other Topics Concern  . 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 Neck:  Supple; no masses or thyromegaly. Lungs:  Clear throughout to  auscultation.    Heart:  Regular rate and rhythm. Abdomen:  Soft, nontender and nondistended. Normal bowel sounds, without guarding, and without rebound.   Neurologic:  Alert and  oriented x4;  grossly normal neurologically.  Impression/Plan: Ethan Austin is now here to undergo a screening colonoscopy.  Risks, benefits, and alternatives regarding colonoscopy have been reviewed with the patient.  Questions have been answered.  All parties agreeable.

## 2016-08-07 ENCOUNTER — Encounter: Payer: Self-pay | Admitting: Gastroenterology

## 2016-08-07 ENCOUNTER — Ambulatory Visit: Admit: 2016-08-07 | Payer: BLUE CROSS/BLUE SHIELD | Admitting: Gastroenterology

## 2016-08-07 SURGERY — COLONOSCOPY WITH PROPOFOL
Anesthesia: General

## 2016-08-10 ENCOUNTER — Encounter: Payer: Self-pay | Admitting: Gastroenterology

## 2016-08-15 ENCOUNTER — Encounter: Payer: Self-pay | Admitting: Family Medicine

## 2016-08-16 ENCOUNTER — Encounter: Payer: Self-pay | Admitting: Gastroenterology

## 2016-08-16 NOTE — Telephone Encounter (Signed)
Pt asking for pathology results from recent colonoscopy.

## 2016-08-19 ENCOUNTER — Encounter: Payer: Self-pay | Admitting: Family Medicine

## 2016-08-26 ENCOUNTER — Other Ambulatory Visit: Payer: Self-pay | Admitting: Family Medicine

## 2016-09-05 ENCOUNTER — Telehealth: Payer: Self-pay | Admitting: Family Medicine

## 2016-09-05 DIAGNOSIS — R918 Other nonspecific abnormal finding of lung field: Secondary | ICD-10-CM

## 2016-09-05 NOTE — Telephone Encounter (Signed)
plz notify it's time to repeat CT chest after several nodules seen last year. Have ordered. Will be non contrast CT. Non urgent. If stable, no need for further repeat.

## 2016-09-05 NOTE — Telephone Encounter (Signed)
-----   Message from Ria Bush, MD sent at 05/10/2016  4:51 PM EDT ----- Rpt CT chest

## 2016-09-20 ENCOUNTER — Encounter: Payer: Self-pay | Admitting: Family Medicine

## 2016-09-20 ENCOUNTER — Ambulatory Visit
Admission: RE | Admit: 2016-09-20 | Discharge: 2016-09-20 | Disposition: A | Discharge status: Home / Self Care | Payer: BLUE CROSS/BLUE SHIELD | Source: Ambulatory Visit | Attending: Family Medicine | Admitting: Family Medicine

## 2016-09-20 DIAGNOSIS — I700 Atherosclerosis of aorta: Secondary | ICD-10-CM | POA: Insufficient documentation

## 2016-09-20 DIAGNOSIS — I2510 Atherosclerotic heart disease of native coronary artery without angina pectoris: Secondary | ICD-10-CM | POA: Insufficient documentation

## 2016-09-20 DIAGNOSIS — K760 Fatty (change of) liver, not elsewhere classified: Secondary | ICD-10-CM | POA: Diagnosis not present

## 2016-09-20 DIAGNOSIS — K8020 Calculus of gallbladder without cholecystitis without obstruction: Secondary | ICD-10-CM | POA: Insufficient documentation

## 2016-09-20 DIAGNOSIS — R918 Other nonspecific abnormal finding of lung field: Secondary | ICD-10-CM | POA: Diagnosis not present

## 2016-09-21 DIAGNOSIS — Z6827 Body mass index (BMI) 27.0-27.9, adult: Secondary | ICD-10-CM | POA: Diagnosis not present

## 2016-09-21 DIAGNOSIS — J329 Chronic sinusitis, unspecified: Secondary | ICD-10-CM | POA: Diagnosis not present

## 2016-09-21 DIAGNOSIS — D169 Benign neoplasm of bone and articular cartilage, unspecified: Secondary | ICD-10-CM | POA: Diagnosis not present

## 2016-09-22 ENCOUNTER — Encounter: Payer: Self-pay | Admitting: Family Medicine

## 2016-10-08 ENCOUNTER — Encounter: Payer: Self-pay | Admitting: Family Medicine

## 2016-11-23 ENCOUNTER — Other Ambulatory Visit: Payer: Self-pay | Admitting: Family Medicine

## 2016-11-23 MED ORDER — TRAMADOL HCL 50 MG PO TABS: ORAL_TABLET | ORAL | 0 refills | Status: DC

## 2016-11-23 NOTE — Telephone Encounter (Signed)
Refill left on vm at pharmacy.  

## 2016-11-23 NOTE — Telephone Encounter (Signed)
plz phoen in. 

## 2016-11-23 NOTE — Telephone Encounter (Signed)
Last printed:  03/08/15, #30 Last OV: 05/10/16 Next OV: none  I do not see CSA or uds

## 2016-12-17 DIAGNOSIS — D229 Melanocytic nevi, unspecified: Secondary | ICD-10-CM | POA: Diagnosis not present

## 2016-12-17 DIAGNOSIS — C44519 Basal cell carcinoma of skin of other part of trunk: Secondary | ICD-10-CM | POA: Diagnosis not present

## 2016-12-17 DIAGNOSIS — L820 Inflamed seborrheic keratosis: Secondary | ICD-10-CM | POA: Diagnosis not present

## 2016-12-17 DIAGNOSIS — D1801 Hemangioma of skin and subcutaneous tissue: Secondary | ICD-10-CM | POA: Diagnosis not present

## 2016-12-17 DIAGNOSIS — L814 Other melanin hyperpigmentation: Secondary | ICD-10-CM | POA: Diagnosis not present

## 2017-01-21 DIAGNOSIS — C44519 Basal cell carcinoma of skin of other part of trunk: Secondary | ICD-10-CM | POA: Diagnosis not present

## 2017-02-13 ENCOUNTER — Encounter: Payer: Self-pay | Admitting: Family Medicine

## 2017-02-15 ENCOUNTER — Ambulatory Visit: Payer: BLUE CROSS/BLUE SHIELD | Admitting: Family Medicine

## 2017-02-15 ENCOUNTER — Encounter: Payer: Self-pay | Admitting: Family Medicine

## 2017-02-15 VITALS — BP 116/58 | HR 68 | Temp 98.6°F | Ht 75.0 in | Wt 224.8 lb

## 2017-02-15 DIAGNOSIS — J019 Acute sinusitis, unspecified: Secondary | ICD-10-CM | POA: Diagnosis not present

## 2017-02-15 MED ORDER — AMOXICILLIN-POT CLAVULANATE 875-125 MG PO TABS: 1.0000 | ORAL_TABLET | Freq: Two times a day (BID) | ORAL | 0 refills | Status: DC

## 2017-02-15 MED ORDER — BENZONATATE 200 MG PO CAPS: 200.0000 mg | ORAL_CAPSULE | Freq: Three times a day (TID) | ORAL | 1 refills | Status: DC | PRN

## 2017-02-15 NOTE — Progress Notes (Signed)
Subjective:    Patient ID: Ethan Austin, male    DOB: 1955-04-01, 61 y.o.   MRN: [id number]  HPI 61 yo pt of Dr Darnell Level here with uri symptoms   Fighting a cold for 3 weeks -not getting better  Last few days -worse  pnd -keeps him awake all night (makes him nauseated)  Prod cough - brown phlegm   Nasal congestion  Ears are full  No sinus pain (however he does not always get facial pain with his sinus infections)  (had sinus surgery a year ago-and did well with that)- does sinus rinse and flonase regularly    Sore throat - feels gritty and raw   No fever    Allergic to doxy and sulfa   Patient Active Problem List   Diagnosis Date Noted  . Sinusitis, acute 02/15/2017  . Thoracic aorta atherosclerosis ([place]) 09/20/2016  . Special screening for malignant neoplasms, colon   . Benign neoplasm of ascending colon   . Left tennis elbow 05/10/2016  . Chronic rhinitis 12/23/2015  . Osteoma 12/23/2015  . Hepatic steatosis 09/09/2015  . CAD (coronary artery disease) 09/09/2015  . Pulmonary nodules/lesions, multiple 08/20/2015  . Benign prostatic hyperplasia with urinary obstruction 05/02/2015  . Mitral valve prolapse   . Chronic sinusitis 04/29/2014  . PND (post-nasal drip) 01/22/2014  . Sciatica 01/04/2014  . Trochanteric bursitis 01/04/2014  . Health care maintenance 04/30/2013  . Atopic rhinitis   . Gastroesophageal reflux disease   . Hyperlipidemia    Past Medical History:  Diagnosis Date  . Allergic rhinitis   . Arthritis    back  . BCC (basal cell carcinoma of skin)    on face and nose  . CAD (coronary artery disease) 09/09/2015   By CT 08/2015   . Dental bridge present    lower right.  also crowns and implants.  Marland Kitchen GERD (gastroesophageal reflux disease)   . H. pylori infection   . Hepatic steatosis 09/09/2015   By CT 08/2015   . History of chicken pox   . HLD (hyperlipidemia)   . Migraine <2005   resolved after sinus surgery, now occasional TTH  . MVP (mitral  valve prolapse)   . Pulmonary nodules/lesions, multiple 08/2015   by CT, all <74mm  . Tennis elbow    left  . Ulnar nerve damage    right   Past Surgical History:  Procedure Laterality Date  . COLONOSCOPY  2008   rpt 10 yrs  . COLONOSCOPY WITH PROPOFOL N/A 08/06/2016   TA, rpt 5 yrs Allen Norris, Darren, MD)  . DENTAL SURGERY  1978   4 teeth removed  . ESOPHAGOGASTRODUODENOSCOPY  1999   stricture dilated  . NASAL SINUS SURGERY  2005   Dr Carlis Abbott  . NASAL SINUS SURGERY Bilateral 2017   chronic rhinosinusitis, L frontal skull base osteoma resected as well ([facility] at [facility])  . POLYPECTOMY  08/06/2016   Procedure: POLYPECTOMY;  Surgeon: [name], MD;  Location: [place];  Service: Endoscopy;;   Social History   Tobacco Use  . Smoking status: Never Smoker  . Smokeless tobacco: Never Used  Substance Use Topics  . Alcohol use: No    Alcohol/week: 0.0 oz  . Drug use: No   Family History  Problem Relation Age of Onset  . Alzheimer's disease Mother   . Stroke Mother   . Hypertension Mother   . Cancer Sister        breast  . Diabetes Maternal  Grandfather   . Diabetes Maternal Grandmother   . CAD Father        mild MI   Allergies  Allergen Reactions  . Sulfur Nausea Only  . Doxycycline Rash   Current Outpatient Medications on File Prior to Visit  Medication Sig Dispense Refill  . aspirin 81 MG tablet Take 81 mg by mouth daily.    Marland Kitchen atorvastatin (LIPITOR) 10 MG tablet TAKE 1 TABLET DAILY AT 6:00 P.M. 90 tablet 3  . cetirizine (ZYRTEC) 10 MG tablet Take 10 mg by mouth 1 day or 1 dose.    . fenofibrate micronized (LOFIBRA) 134 MG capsule TAKE 1 CAPSULE DAILY BEFORE BREAKFAST 90 capsule 3  . fluticasone (FLONASE) 50 MCG/ACT nasal spray Place 2 sprays into both nostrils 2 (two) times daily.    . NON FORMULARY NEILMED SINUS RINSE    . omega-3 acid ethyl esters (LOVAZA) 1 g capsule TAKE 4 CAPSULES DAILY 360 capsule 2  . omeprazole (PRILOSEC) 20 MG capsule TAKE 1  CAPSULE DAILY 90 capsule 3  . traMADol (ULTRAM) 50 MG tablet TAKE 1 TABLET BY MOUTH 2 TIMES A DAY AS NEEDED FOR PAIN 30 tablet 0   No current facility-administered medications on file prior to visit.     Review of Systems  Constitutional: Positive for appetite change and fatigue. Negative for fever.  HENT: Positive for congestion, postnasal drip, rhinorrhea, sinus pressure, sneezing and sore throat. Negative for ear pain.   Eyes: Negative for pain and discharge.  Respiratory: Positive for cough. Negative for shortness of breath, wheezing and stridor.   Cardiovascular: Negative for chest pain.  Gastrointestinal: Negative for diarrhea, nausea and vomiting.  Genitourinary: Negative for frequency, hematuria and urgency.  Musculoskeletal: Negative for arthralgias and myalgias.  Skin: Negative for rash.  Neurological: Positive for headaches. Negative for dizziness, weakness and light-headedness.  Psychiatric/Behavioral: Negative for confusion and dysphoric mood.       Objective:   Physical Exam  Constitutional: He appears well-developed and well-nourished. No distress.  HENT:  Head: Normocephalic and atraumatic.  Right Ear: External ear normal.  Left Ear: External ear normal.  Mouth/Throat: Oropharynx is clear and moist. No oropharyngeal exudate.  Nares are injected and congested   Very mild maxillary sinus tenderness TMs dull pnd and clear rhinorrhea  Eyes: Conjunctivae and EOM are normal. Pupils are equal, round, and reactive to light. Right eye exhibits no discharge. Left eye exhibits no discharge.  Neck: Normal range of motion. Neck supple.  Cardiovascular: Normal rate and regular rhythm.  Pulmonary/Chest: Effort normal and breath sounds normal. No respiratory distress. He has no wheezes. He has no rales. He exhibits no tenderness.  Good air exch  No rales or rhonchi   Musculoskeletal: Normal range of motion.  Lymphadenopathy:    He has no cervical adenopathy.  Neurological: He  is alert. No cranial nerve deficit.  Skin: Skin is warm and dry. No rash noted. No erythema.  Psychiatric: He has a normal mood and affect.          Assessment & Plan:   Problem List Items Addressed This Visit      Respiratory   Sinusitis, acute    Cover with augmentin  Disc symptomatic care - see instructions on AVS : Drink lots of fluids  Continue nasal irrigation with saline and the flonase and the zyrtec  You can try ibuprofen over the counter as directed with food for headache and sinus pressure  For cough-try the tessalon  Either nyquil or  mucinex dm or robitusin dm for cough and congestion and runny nose  The augmentin is for bacterial infection   Update if not starting to improve in a week or if worsening        Relevant Medications   amoxicillin-clavulanate (AUGMENTIN) 875-125 MG tablet   benzonatate (TESSALON) 200 MG capsule

## 2017-02-15 NOTE — Patient Instructions (Signed)
Drink lots of fluids  Continue nasal irrigation with saline and the flonase and the zyrtec  You can try ibuprofen over the counter as directed with food for headache and sinus pressure  For cough-try the tessalon  Either nyquil or mucinex dm or robitusin dm for cough and congestion and runny nose  The augmentin is for bacterial infection   Update if not starting to improve in a week or if worsening

## 2017-02-15 NOTE — Telephone Encounter (Deleted)
In response to pt's MyChart message (see Pt email, 02/13/17), I called [phone] provided by the pt.  I was then given the number [phone] to call for [facility].

## 2017-02-15 NOTE — Telephone Encounter (Signed)
In response to pt's MyChart message (see Pt email, 02/13/17), I called [phone] provided by the pt.  I was then given the number [phone] to call for [facility].  I called [facility] and was told pt's med records would need to be mailed to [place], [place]  [address] for review.  Can we do that?

## 2017-02-17 NOTE — Assessment & Plan Note (Signed)
Cover with augmentin  Disc symptomatic care - see instructions on AVS : Drink lots of fluids  Continue nasal irrigation with saline and the flonase and the zyrtec  You can try ibuprofen over the counter as directed with food for headache and sinus pressure  For cough-try the tessalon  Either nyquil or mucinex dm or robitusin dm for cough and congestion and runny nose  The augmentin is for bacterial infection   Update if not starting to improve in a week or if worsening

## 2017-02-19 NOTE — Telephone Encounter (Signed)
Ok to do this thank you.  

## 2017-03-04 DIAGNOSIS — L814 Other melanin hyperpigmentation: Secondary | ICD-10-CM | POA: Diagnosis not present

## 2017-03-04 DIAGNOSIS — L821 Other seborrheic keratosis: Secondary | ICD-10-CM | POA: Diagnosis not present

## 2017-03-04 DIAGNOSIS — Z85828 Personal history of other malignant neoplasm of skin: Secondary | ICD-10-CM | POA: Diagnosis not present

## 2017-03-07 ENCOUNTER — Encounter: Payer: Self-pay | Admitting: Family Medicine

## 2017-03-16 ENCOUNTER — Encounter: Payer: Self-pay | Admitting: Family Medicine

## 2017-03-20 NOTE — Telephone Encounter (Signed)
Ethan Austin - Can you help [place] with this? Insurance is not approving lipitor and he needs his cholesterol medicine

## 2017-03-20 NOTE — Telephone Encounter (Signed)
Ok, I spoke with [employer] [phone]) and was told that our office is not in the pt's network.  So they will not process rx for pt.  He would need to find an in- network PCP.  Given ref #:  [id number] for the call.

## 2017-03-20 NOTE — Telephone Encounter (Signed)
Can you check on this and update patient? What records are being requested? I asked this to be done 02/19/17. See pt email from then.

## 2017-03-21 NOTE — Telephone Encounter (Signed)
Ethan Austin can you try to call Rx in again with new email and effective date from credentialing then update patient?  We still don't have an answer as to what the issue is.

## 2017-03-21 NOTE — Telephone Encounter (Signed)
I have spent over an hour trying to find an end to this pt's situation and still have no resolution.  I tried to provide the active date with [employer]BS (10/19/2009) but was told we need to contact the "local" since we are in [place].    Today's ref # for the call is:  [id number] for [place].

## 2017-03-22 NOTE — Telephone Encounter (Signed)
Erin plz look into this. Can you call local [employer]BS or patient to see if they can shed any more light on this?

## 2017-03-26 NOTE — Telephone Encounter (Signed)
Agree with all this. Thank you for all of your work.  He does have CVD - h/o CAD and aortic ATH.

## 2017-03-26 NOTE — Telephone Encounter (Addendum)
Erin and I have spent many hours researching this for patient on 03/22/17.  After exhausting many avenues ([facility], [employer] [place] and [employer]BS [place] in [place]) we have all been told the same things as follows:  1.  Dr. Synthia Innocent NPI number is correct and credentialed with our state [place].  [employer] acknowledges credentialing at the state level and credentialing verified. (This was not the issue). 2.  Patient's atorvastatin verified with [facility]/insurance that prior authorization is NOT required and they DO NOT need any medical records to verify this.  3.  Patient states that he is receiving his medications and atorvastatin is at a cost of $17.29 per 3 mths.  Patient states that previously his charge was $0.00; however, [facility] reports that they have been billing him $17.29 as far back as 2017.  Patient called his insurance company and stated he was told:  In order to receive atorvastatin for $0.00, the Dr's office has to call [phone] and tell the agent the following "patient meets criteria for the [place] preventative service task force recommendations for adults 40-75 without hx of CVD".    I called the number provided by the patient (this was the authorization department) and gave them that information.  Agent got her manager and both reps had no idea what I was talking about and denied ever sending out any letters to patients.  I have asked the patient to provide [place] with his letter; however, he cannot find it.    Patient informed of all above information.  I am at a loss as to how to proceed with patient.  I have offered for patient to come in to the office and we can try calling together but I am not getting any where otherwise.   Patient will meet with me 1:1 on 03/28/17 to call company together.    Dr. Darnell Level:  In case we reach that point, do you agree with the above bolded statement for patient?  Patient (as I interpret his med hx, does have CVD).  I will ask for clarification around  statement's use of with or without.   Thanks.

## 2017-04-11 NOTE — Telephone Encounter (Signed)
Patient came in last week and we reviewed the letter he received in regards to receiving atorvastatin at $0.00 expense.  The letter specifically states that patient can receive the medication at 0 cost if the provider will make a statement that the patient has no hx of CVD.  We cannot say this due to the fact that patient has CVD (CAD and Aortic ATH).  This negates patient from qualifying for this discount.  I did review the letter with Dr. Darnell Level and he agrees.    Phone call made to patient today (left detailed message on cell - okay per DPR) to inform him).  Dr. Darnell Level did state that we could try calling in Atorvastatin 20mg  daily and just have patient take 1/2 pill as this is all patient requires for management.  We do not know whether that will be viewed as "more appropriate" in insurance eyes and may qualify him for the 0 expense but we would be willing to try if patient wanted to.  Patient is to call back and let me know what he wants to do.

## 2017-05-01 ENCOUNTER — Other Ambulatory Visit: Payer: Self-pay | Admitting: Family Medicine

## 2017-05-09 ENCOUNTER — Other Ambulatory Visit: Payer: Self-pay | Admitting: Family Medicine

## 2017-05-09 ENCOUNTER — Other Ambulatory Visit (INDEPENDENT_AMBULATORY_CARE_PROVIDER_SITE_OTHER): Payer: BLUE CROSS/BLUE SHIELD

## 2017-05-09 DIAGNOSIS — Z125 Encounter for screening for malignant neoplasm of prostate: Secondary | ICD-10-CM | POA: Diagnosis not present

## 2017-05-09 DIAGNOSIS — E782 Mixed hyperlipidemia: Secondary | ICD-10-CM

## 2017-05-10 LAB — LIPID PANEL
Cholesterol: 152 mg/dL (ref 0–200)
HDL: 34.2 mg/dL — AB (ref >39.00)
LDL CALC: 99 mg/dL (ref 0–99)
NONHDL: 118.1
Total CHOL/HDL Ratio: 4
Triglycerides: 95 mg/dL (ref 0.0–149.0)
VLDL: 19 mg/dL (ref 0.0–40.0)

## 2017-05-10 LAB — COMPREHENSIVE METABOLIC PANEL
ALK PHOS: 45 U/L (ref 39–117)
ALT: 46 U/L (ref 0–53)
AST: 27 U/L (ref 0–37)
Albumin: 4.5 g/dL (ref 3.5–5.2)
BUN: 17 mg/dL (ref 6–23)
CHLORIDE: 104 meq/L (ref 96–112)
CO2: 27 meq/L (ref 19–32)
Calcium: 9.9 mg/dL (ref 8.4–10.5)
Creatinine, Ser: 1.31 mg/dL (ref 0.40–1.50)
GFR: 58.74 mL/min — AB (ref >60.00)
Glucose, Bld: 90 mg/dL (ref 70–99)
POTASSIUM: 4.1 meq/L (ref 3.5–5.1)
SODIUM: 141 meq/L (ref 135–145)
TOTAL PROTEIN: 6.9 g/dL (ref 6.0–8.3)
Total Bilirubin: 1 mg/dL (ref 0.2–1.2)

## 2017-05-10 LAB — PSA: PSA: 3 ng/mL (ref 0.10–4.00)

## 2017-05-17 ENCOUNTER — Ambulatory Visit (INDEPENDENT_AMBULATORY_CARE_PROVIDER_SITE_OTHER): Payer: BLUE CROSS/BLUE SHIELD | Admitting: Family Medicine

## 2017-05-17 ENCOUNTER — Encounter: Payer: Self-pay | Admitting: Family Medicine

## 2017-05-17 VITALS — BP 140/64 | HR 76 | Temp 98.3°F | Ht 74.0 in | Wt 220.0 lb

## 2017-05-17 DIAGNOSIS — E782 Mixed hyperlipidemia: Secondary | ICD-10-CM | POA: Diagnosis not present

## 2017-05-17 DIAGNOSIS — Z0000 Encounter for general adult medical examination without abnormal findings: Secondary | ICD-10-CM

## 2017-05-17 DIAGNOSIS — N138 Other obstructive and reflux uropathy: Secondary | ICD-10-CM

## 2017-05-17 DIAGNOSIS — K219 Gastro-esophageal reflux disease without esophagitis: Secondary | ICD-10-CM | POA: Diagnosis not present

## 2017-05-17 DIAGNOSIS — I700 Atherosclerosis of aorta: Secondary | ICD-10-CM | POA: Diagnosis not present

## 2017-05-17 DIAGNOSIS — I341 Nonrheumatic mitral (valve) prolapse: Secondary | ICD-10-CM | POA: Diagnosis not present

## 2017-05-17 DIAGNOSIS — I2510 Atherosclerotic heart disease of native coronary artery without angina pectoris: Secondary | ICD-10-CM

## 2017-05-17 DIAGNOSIS — K760 Fatty (change of) liver, not elsewhere classified: Secondary | ICD-10-CM | POA: Diagnosis not present

## 2017-05-17 DIAGNOSIS — N401 Enlarged prostate with lower urinary tract symptoms: Secondary | ICD-10-CM

## 2017-05-17 MED ORDER — OMEPRAZOLE 20 MG PO CPDR: 20.0000 mg | DELAYED_RELEASE_CAPSULE | Freq: Every day | ORAL | 3 refills | Status: DC

## 2017-05-17 NOTE — Assessment & Plan Note (Signed)
On aspirin, statin.  

## 2017-05-17 NOTE — Progress Notes (Signed)
BP 140/64 (BP Location: Left Arm, Patient Position: Sitting, Cuff Size: Normal)   Pulse 76   Temp 98.3 F (36.8 C) (Oral)   Ht 6\' 2"  (1.88 m)   Wt 220 lb (99.8 kg)   SpO2 96%   BMI 28.25 kg/m    CC: CPE Subjective:    Patient ID: Ethan Austin, male    DOB: 1955/12/07, 62 y.o.   MRN: [id number]  HPI: JSEAN TAUSSIG is a 62 y.o. male presenting on 05/17/2017 for Annual Exam (Has form to be completed and faxed. )   Mult pulm nodules found on CT last summer - stable rpt CT 09/2016. Not high risk for TB - consider PPD. Endorses rare night sweats.   HLD - alternates fenofibrate and lipitor daily. Takes lovaza 2gm daily.   Preventative: COLONOSCOPY WITH PROPOFOL 08/06/2016 TA, rpt 5 yrs Allen Norris, Darren, MD) Prostate cancer screening - discussed, would like screening. Nocturia x1 with weakening of stream.  Flu yearly  Tdap - 2014  zostavax 2016.  shingrix - discussed Seat belt use discussed Sunscreen use discussed. Sees derm regularly. H/o BCC  Non smoker  Alcohol - none   Caffeine: 2 cups coffee in am, 1 tea at night Lives with wife and 2 children Occupation: Armed forces logistics/support/administrative officer at [employer]: some college Activity: [employer] with wife, walks regularly Diet: good water, fruits/vegetables daily   Relevant past medical, surgical, family and social history reviewed and updated as indicated. Interim medical history since our last visit reviewed. Allergies and medications reviewed and updated. Outpatient Medications Prior to Visit  Medication Sig Dispense Refill  . aspirin 81 MG tablet Take 81 mg by mouth daily.    Marland Kitchen atorvastatin (LIPITOR) 10 MG tablet TAKE 1 TABLET DAILY AT 6:00 P.M. 90 tablet 3  . cetirizine (ZYRTEC) 10 MG tablet Take 10 mg by mouth 1 day or 1 dose.    . fenofibrate micronized (LOFIBRA) 134 MG capsule TAKE 1 CAPSULE DAILY BEFORE BREAKFAST 90 capsule 3  . fluticasone (FLONASE) 50 MCG/ACT nasal spray Place 2 sprays into both nostrils 2 (two) times  daily.    . NON FORMULARY NEILMED SINUS RINSE    . omega-3 acid ethyl esters (LOVAZA) 1 g capsule TAKE 4 CAPSULES DAILY 360 capsule 2  . traMADol (ULTRAM) 50 MG tablet TAKE 1 TABLET BY MOUTH 2 TIMES A DAY AS NEEDED FOR PAIN 30 tablet 0  . omeprazole (PRILOSEC) 20 MG capsule TAKE 1 CAPSULE DAILY 90 capsule 0  . amoxicillin-clavulanate (AUGMENTIN) 875-125 MG tablet Take 1 tablet by mouth 2 (two) times daily. 14 tablet 0  . benzonatate (TESSALON) 200 MG capsule Take 1 capsule (200 mg total) by mouth 3 (three) times daily as needed. Swallow whole, to not bite pill 30 capsule 1   No facility-administered medications prior to visit.      Per HPI unless specifically indicated in ROS section below Review of Systems  Constitutional: Negative for activity change, appetite change, chills, fatigue, fever and unexpected weight change.  HENT: Negative for hearing loss.        Dry mouth  Eyes: Negative for visual disturbance.  Respiratory: Negative for cough, chest tightness, shortness of breath and wheezing.   Cardiovascular: Negative for chest pain, palpitations and leg swelling.  Gastrointestinal: Positive for diarrhea and nausea. Negative for abdominal distention, abdominal pain, blood in stool, constipation and vomiting.       Recent GI bug  Genitourinary: Negative for difficulty urinating and hematuria.  Musculoskeletal: Negative  for arthralgias, myalgias and neck pain.  Skin: Negative for rash.  Neurological: Negative for dizziness, seizures, syncope and headaches.  Hematological: Negative for adenopathy. Does not bruise/bleed easily.  Psychiatric/Behavioral: Negative for dysphoric mood. The patient is not nervous/anxious.        Objective:    BP 140/64 (BP Location: Left Arm, Patient Position: Sitting, Cuff Size: Normal)   Pulse 76   Temp 98.3 F (36.8 C) (Oral)   Ht 6\' 2"  (1.88 m)   Wt 220 lb (99.8 kg)   SpO2 96%   BMI 28.25 kg/m   Wt Readings from Last 3 Encounters:  05/17/17 220  lb (99.8 kg)  02/15/17 224 lb 12 oz (101.9 kg)  08/06/16 211 lb (95.7 kg)    Physical Exam  Constitutional: He is oriented to person, place, and time. He appears well-developed and well-nourished. No distress.  HENT:  Head: Normocephalic and atraumatic.  Right Ear: Hearing, tympanic membrane, external ear and ear canal normal.  Left Ear: Hearing, tympanic membrane, external ear and ear canal normal.  Nose: Nose normal.  Mouth/Throat: Uvula is midline, oropharynx is clear and moist and mucous membranes are normal. No oropharyngeal exudate, posterior oropharyngeal edema or posterior oropharyngeal erythema.  Eyes: Pupils are equal, round, and reactive to light. Conjunctivae and EOM are normal. No scleral icterus.  Neck: Normal range of motion. Neck supple. Carotid bruit is not present. No thyromegaly present.  Cardiovascular: Normal rate, regular rhythm, normal heart sounds and intact distal pulses.  No murmur heard. Pulses:      Radial pulses are 2+ on the right side, and 2+ on the left side.  Pulmonary/Chest: Effort normal and breath sounds normal. No respiratory distress. He has no wheezes. He has no rales.  Abdominal: Soft. Bowel sounds are normal. He exhibits no distension and no mass. There is no tenderness. There is no rebound and no guarding.  Genitourinary: Rectum normal. Rectal exam shows no external hemorrhoid, no internal hemorrhoid, no fissure, no mass, no tenderness and anal tone normal. Prostate is enlarged (20gm). Prostate is not tender.  Musculoskeletal: Normal range of motion. He exhibits no edema.  Lymphadenopathy:    He has no cervical adenopathy.  Neurological: He is alert and oriented to person, place, and time.  CN grossly intact, station and gait intact  Skin: Skin is warm and dry. No rash noted.  Psychiatric: He has a normal mood and affect. His behavior is normal. Judgment and thought content normal.  Nursing note and vitals reviewed.  Results for orders placed or  performed in visit on 05/09/17  PSA  Result Value Ref Range   PSA 3.00 0.10 - 4.00 ng/mL  Comprehensive metabolic panel  Result Value Ref Range   Sodium 141 135 - 145 mEq/L   Potassium 4.1 3.5 - 5.1 mEq/L   Chloride 104 96 - 112 mEq/L   CO2 27 19 - 32 mEq/L   Glucose, Bld 90 70 - 99 mg/dL   BUN 17 6 - 23 mg/dL   Creatinine, Ser 1.31 0.40 - 1.50 mg/dL   Total Bilirubin 1.0 0.2 - 1.2 mg/dL   Alkaline Phosphatase 45 39 - 117 U/L   AST 27 0 - 37 U/L   ALT 46 0 - 53 U/L   Total Protein 6.9 6.0 - 8.3 g/dL   Albumin 4.5 3.5 - 5.2 g/dL   Calcium 9.9 8.4 - 10.5 mg/dL   GFR 58.74 (L) >60.00 mL/min  Lipid panel  Result Value Ref Range   Cholesterol  152 0 - 200 mg/dL   Triglycerides 95.0 0.0 - 149.0 mg/dL   HDL 34.20 (L) >39.00 mg/dL   VLDL 19.0 0.0 - 40.0 mg/dL   LDL Cholesterol 99 0 - 99 mg/dL   Total CHOL/HDL Ratio 4    NonHDL 118.10       Assessment & Plan:   Problem List Items Addressed This Visit    Benign prostatic hyperplasia with urinary obstruction    Stable DRE/PSA. PSA bumped up - will recheck in 6 months.       Relevant Orders   PSA   CAD (coronary artery disease)    Asxs. On aspirin, statin.       Gastroesophageal reflux disease    Chronic, stable on low dose PPI daily.       Relevant Medications   omeprazole (PRILOSEC) 20 MG capsule   Health care maintenance - Primary    Preventative protocols reviewed and updated unless pt declined. Discussed healthy diet and lifestyle.       Hepatic steatosis    LFTs normal.       Hyperlipidemia    Chronic, stable on current regimen. Takes lofibra and lipitor QOD alternating. Advised stop lofibra, start lipitor QD. Continue lovaza. Recheck levels in 6 months.  The 10-year ASCVD risk score Mikey Bussing DC Brooke Bonito., et al., 2013) is: 12%   Values used to calculate the score:     Age: 29 years     Sex: Male     Is Non-Hispanic African American: No     Diabetic: No     Tobacco smoker: No     Systolic Blood Pressure: 161  mmHg     Is BP treated: No     HDL Cholesterol: 34.2 mg/dL     Total Cholesterol: 152 mg/dL       Relevant Orders   Lipid panel   Mitral valve prolapse   Thoracic aorta atherosclerosis (HCC)    On aspirin, statin.           Meds ordered this encounter  Medications  . omeprazole (PRILOSEC) 20 MG capsule    Sig: Take 1 capsule (20 mg total) by mouth daily.    Dispense:  90 capsule    Refill:  3   Orders Placed This Encounter  Procedures  . PSA    Standing Status:   Future    Standing Expiration Date:   05/18/2018  . Lipid panel    Standing Status:   Future    Standing Expiration Date:   05/18/2018    Follow up plan: Return in about 1 year (around 05/18/2018) for annual exam, prior fasting for blood work.  Ria Bush, MD

## 2017-05-17 NOTE — Patient Instructions (Addendum)
If interested, check with pharmacy about new 2 shot shingles series (shingrix).  Change lipitor to 1 tablet daily, back off lofibra. Continue lovaza. Recheck levels in 6 months (lab visit only).  You are doing well today. Return in 1 year for next physical.   Health Maintenance, Male A healthy lifestyle and preventive care is important for your health and wellness. Ask your health care provider about what schedule of regular examinations is right for you. What should I know about weight and diet? Eat a Healthy Diet  Eat plenty of vegetables, fruits, whole grains, low-fat dairy products, and lean protein.  Do not eat a lot of foods high in solid fats, added sugars, or salt.  Maintain a Healthy Weight Regular exercise can help you achieve or maintain a healthy weight. You should:  Do at least 150 minutes of exercise each week. The exercise should increase your heart rate and make you sweat (moderate-intensity exercise).  Do strength-training exercises at least twice a week.  Watch Your Levels of Cholesterol and Blood Lipids  Have your blood tested for lipids and cholesterol every 5 years starting at 63 years of age. If you are at high risk for heart disease, you should start having your blood tested when you are 63 years old. You may need to have your cholesterol levels checked more often if: ? Your lipid or cholesterol levels are high. ? You are older than 63 years of age. ? You are at high risk for heart disease.  What should I know about cancer screening? Many types of cancers can be detected early and may often be prevented. Lung Cancer  You should be screened every year for lung cancer if: ? You are a current smoker who has smoked for at least 30 years. ? You are a former smoker who has quit within the past 15 years.  Talk to your health care provider about your screening options, when you should start screening, and how often you should be screened.  Colorectal  Cancer  Routine colorectal cancer screening usually begins at 63 years of age and should be repeated every 5-10 years until you are 63 years old. You may need to be screened more often if early forms of precancerous polyps or small growths are found. Your health care provider may recommend screening at an earlier age if you have risk factors for colon cancer.  Your health care provider may recommend using home test kits to check for hidden blood in the stool.  A small camera at the end of a tube can be used to examine your colon (sigmoidoscopy or colonoscopy). This checks for the earliest forms of colorectal cancer.  Prostate and Testicular Cancer  Depending on your age and overall health, your health care provider may do certain tests to screen for prostate and testicular cancer.  Talk to your health care provider about any symptoms or concerns you have about testicular or prostate cancer.  Skin Cancer  Check your skin from head to toe regularly.  Tell your health care provider about any new moles or changes in moles, especially if: ? There is a change in a mole's size, shape, or color. ? You have a mole that is larger than a pencil eraser.  Always use sunscreen. Apply sunscreen liberally and repeat throughout the day.  Protect yourself by wearing long sleeves, pants, a wide-brimmed hat, and sunglasses when outside.  What should I know about heart disease, diabetes, and high blood pressure?  If you are  62-29 years of age, have your blood pressure checked every 3-5 years. If you are 26 years of age or older, have your blood pressure checked every year. You should have your blood pressure measured twice-once when you are at a hospital or clinic, and once when you are not at a hospital or clinic. Record the average of the two measurements. To check your blood pressure when you are not at a hospital or clinic, you can use: ? An automated blood pressure machine at a pharmacy. ? A home blood  pressure monitor.  Talk to your health care provider about your target blood pressure.  If you are between 90-30 years old, ask your health care provider if you should take aspirin to prevent heart disease.  Have regular diabetes screenings by checking your fasting blood sugar level. ? If you are at a normal weight and have a low risk for diabetes, have this test once every three years after the age of 79. ? If you are overweight and have a high risk for diabetes, consider being tested at a younger age or more often.  A one-time screening for abdominal aortic aneurysm (AAA) by ultrasound is recommended for men aged 80-75 years who are current or former smokers. What should I know about preventing infection? Hepatitis B If you have a higher risk for hepatitis B, you should be screened for this virus. Talk with your health care provider to find out if you are at risk for hepatitis B infection. Hepatitis C Blood testing is recommended for:  Everyone born from [birth year] through 1966.  Anyone with known risk factors for hepatitis C.  Sexually Transmitted Diseases (STDs)  You should be screened each year for STDs including gonorrhea and chlamydia if: ? You are sexually active and are younger than 63 years of age. ? You are older than 63 years of age and your health care provider tells you that you are at risk for this type of infection. ? Your sexual activity has changed since you were last screened and you are at an increased risk for chlamydia or gonorrhea. Ask your health care provider if you are at risk.  Talk with your health care provider about whether you are at high risk of being infected with HIV. Your health care provider may recommend a prescription medicine to help prevent HIV infection.  What else can I do?  Schedule regular health, dental, and eye exams.  Stay current with your vaccines (immunizations).  Do not use any tobacco products, such as cigarettes, chewing tobacco, and  e-cigarettes. If you need help quitting, ask your health care provider.  Limit alcohol intake to no more than 2 drinks per day. One drink equals 12 ounces of beer, 5 ounces of wine, or 1 ounces of hard liquor.  Do not use street drugs.  Do not share needles.  Ask your health care provider for help if you need support or information about quitting drugs.  Tell your health care provider if you often feel depressed.  Tell your health care provider if you have ever been abused or do not feel safe at home. This information is not intended to replace advice given to you by your health care provider. Make sure you discuss any questions you have with your health care provider. Document Released: 08/04/2007 Document Revised: 10/05/2015 Document Reviewed: 11/09/2014 Elsevier Interactive Patient Education  [employer].

## 2017-05-17 NOTE — Assessment & Plan Note (Signed)
LFTs normal

## 2017-05-17 NOTE — Assessment & Plan Note (Signed)
Asxs. On aspirin, statin.

## 2017-05-17 NOTE — Assessment & Plan Note (Addendum)
Stable DRE/PSA. PSA bumped up - will recheck in 6 months.

## 2017-05-17 NOTE — Assessment & Plan Note (Signed)
Chronic, stable on low dose PPI daily.

## 2017-05-17 NOTE — Assessment & Plan Note (Addendum)
Chronic, stable on current regimen. Takes lofibra and lipitor QOD alternating. Advised stop lofibra, start lipitor QD. Continue lovaza. Recheck levels in 6 months.  The 10-year ASCVD risk score Mikey Bussing DC Brooke Bonito., et al., 2013) is: 12%   Values used to calculate the score:     Age: 62 years     Sex: Male     Is Non-Hispanic African American: No     Diabetic: No     Tobacco smoker: No     Systolic Blood Pressure: 161 mmHg     Is BP treated: No     HDL Cholesterol: 34.2 mg/dL     Total Cholesterol: 152 mg/dL

## 2017-05-17 NOTE — Assessment & Plan Note (Signed)
Preventative protocols reviewed and updated unless pt declined. Discussed healthy diet and lifestyle.  

## 2017-06-08 NOTE — Telephone Encounter (Signed)
opened in error

## 2017-06-26 ENCOUNTER — Encounter: Payer: Self-pay | Admitting: Family Medicine

## 2017-06-27 NOTE — Telephone Encounter (Signed)
Relation to pt: self Call back number: [phone] Ethan Austin)  Pharmacy: [facility], [place] [phone] (Phone) [phone] (Fax)    Reason for call:  [facility] states omeprazole (PRILOSEC) 20 MG capsule rX was sent in on 05/02/17 therefore the Rx that was sent in on 05/17/17 was voided because it was to soon (chart doesn't reflect 05/02/17 )

## 2017-06-28 DIAGNOSIS — D169 Benign neoplasm of bone and articular cartilage, unspecified: Secondary | ICD-10-CM | POA: Diagnosis not present

## 2017-06-28 DIAGNOSIS — Z6827 Body mass index (BMI) 27.0-27.9, adult: Secondary | ICD-10-CM | POA: Diagnosis not present

## 2017-06-28 DIAGNOSIS — J329 Chronic sinusitis, unspecified: Secondary | ICD-10-CM | POA: Diagnosis not present

## 2017-07-02 MED ORDER — OMEPRAZOLE 20 MG PO CPDR: 20.0000 mg | DELAYED_RELEASE_CAPSULE | Freq: Every day | ORAL | 3 refills | Status: DC

## 2017-07-04 ENCOUNTER — Encounter: Payer: Self-pay | Admitting: Family Medicine

## 2017-07-04 NOTE — Telephone Encounter (Signed)
Spoke to [place] at [facility]. The problem is that he is asking for the refill too soon. Omeprazole, not Prilosec, will be released for send out on May 22nd.

## 2017-08-18 DIAGNOSIS — K828 Other specified diseases of gallbladder: Secondary | ICD-10-CM | POA: Diagnosis not present

## 2017-08-18 DIAGNOSIS — Z7982 Long term (current) use of aspirin: Secondary | ICD-10-CM | POA: Diagnosis not present

## 2017-08-18 DIAGNOSIS — N202 Calculus of kidney with calculus of ureter: Secondary | ICD-10-CM | POA: Diagnosis not present

## 2017-08-18 DIAGNOSIS — R109 Unspecified abdominal pain: Secondary | ICD-10-CM | POA: Diagnosis not present

## 2017-08-18 DIAGNOSIS — N201 Calculus of ureter: Secondary | ICD-10-CM | POA: Diagnosis not present

## 2017-08-18 DIAGNOSIS — N132 Hydronephrosis with renal and ureteral calculous obstruction: Secondary | ICD-10-CM | POA: Diagnosis not present

## 2017-08-19 DIAGNOSIS — N202 Calculus of kidney with calculus of ureter: Secondary | ICD-10-CM | POA: Diagnosis not present

## 2017-08-19 DIAGNOSIS — N200 Calculus of kidney: Secondary | ICD-10-CM

## 2017-08-19 DIAGNOSIS — N201 Calculus of ureter: Secondary | ICD-10-CM | POA: Diagnosis not present

## 2017-08-19 HISTORY — PX: CYSTOSCOPY W/ URETEROSCOPY W/ LITHOTRIPSY: SUR380

## 2017-08-19 HISTORY — PX: URETERAL STENT PLACEMENT: SHX822

## 2017-08-19 HISTORY — DX: Calculus of kidney: N20.0

## 2017-08-21 DIAGNOSIS — Z882 Allergy status to sulfonamides status: Secondary | ICD-10-CM | POA: Diagnosis not present

## 2017-08-21 DIAGNOSIS — Z01818 Encounter for other preprocedural examination: Secondary | ICD-10-CM | POA: Diagnosis not present

## 2017-08-21 DIAGNOSIS — K219 Gastro-esophageal reflux disease without esophagitis: Secondary | ICD-10-CM | POA: Diagnosis not present

## 2017-08-21 DIAGNOSIS — Z6827 Body mass index (BMI) 27.0-27.9, adult: Secondary | ICD-10-CM | POA: Diagnosis not present

## 2017-08-21 DIAGNOSIS — N200 Calculus of kidney: Secondary | ICD-10-CM | POA: Diagnosis not present

## 2017-08-21 DIAGNOSIS — Z79899 Other long term (current) drug therapy: Secondary | ICD-10-CM | POA: Diagnosis not present

## 2017-08-21 DIAGNOSIS — Z7982 Long term (current) use of aspirin: Secondary | ICD-10-CM | POA: Diagnosis not present

## 2017-08-21 DIAGNOSIS — E7800 Pure hypercholesterolemia, unspecified: Secondary | ICD-10-CM | POA: Diagnosis not present

## 2017-08-21 DIAGNOSIS — Z881 Allergy status to other antibiotic agents status: Secondary | ICD-10-CM | POA: Diagnosis not present

## 2017-08-21 DIAGNOSIS — Z7951 Long term (current) use of inhaled steroids: Secondary | ICD-10-CM | POA: Diagnosis not present

## 2017-08-21 DIAGNOSIS — R001 Bradycardia, unspecified: Secondary | ICD-10-CM | POA: Diagnosis not present

## 2017-08-21 DIAGNOSIS — N132 Hydronephrosis with renal and ureteral calculous obstruction: Secondary | ICD-10-CM | POA: Diagnosis not present

## 2017-08-21 DIAGNOSIS — N201 Calculus of ureter: Secondary | ICD-10-CM | POA: Diagnosis not present

## 2017-08-23 ENCOUNTER — Encounter: Payer: Self-pay | Admitting: Family Medicine

## 2017-08-23 DIAGNOSIS — N200 Calculus of kidney: Secondary | ICD-10-CM | POA: Insufficient documentation

## 2017-08-27 IMAGING — CT CT CHEST W/O CM
1 series · 15 of 34 positions shown, 19 images · non-contrast
Comparison: None.

CLINICAL DATA: 61 y/o  M; follow-up of lung nodules.

EXAM:
CT CHEST WITHOUT CONTRAST
TECHNIQUE: Multidetector CT imaging of the chest was performed following the
standard protocol without IV contrast.

[Series 2: thorax · axial · 0.73mm/px · z∈[-678,-366]mm · 15 of 184 slices shown, 19 images]
[im 14/184  mediastinal]
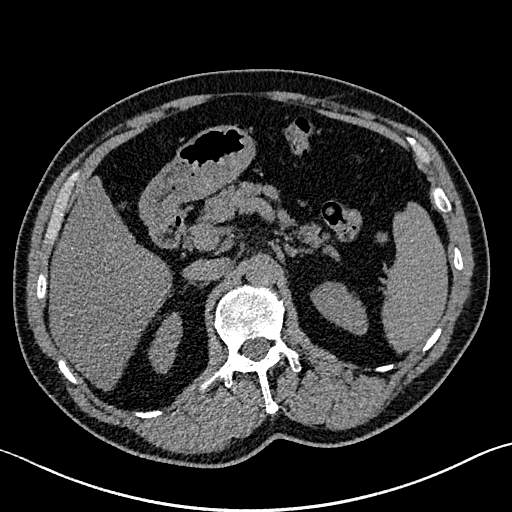
[im 14/184  lung]
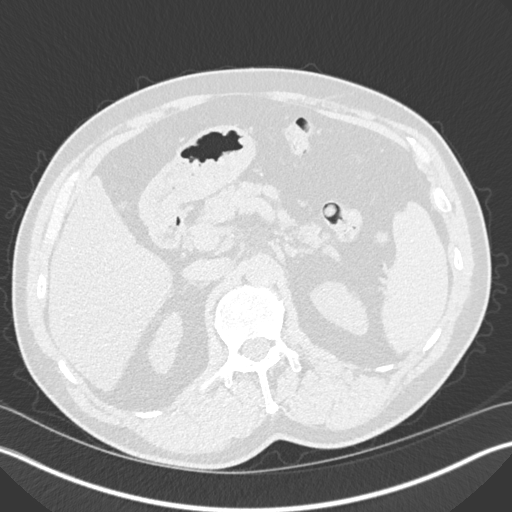
[im 28/184  lung]
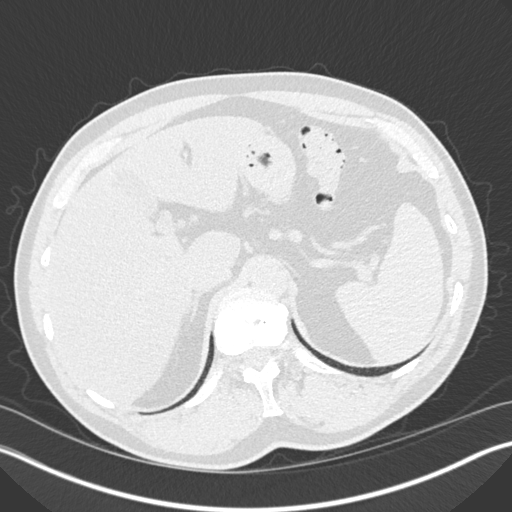
[im 37/184  lung]
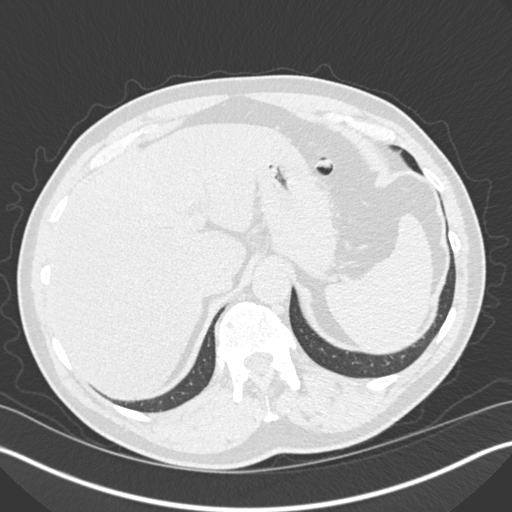
[im 48/184  lung]
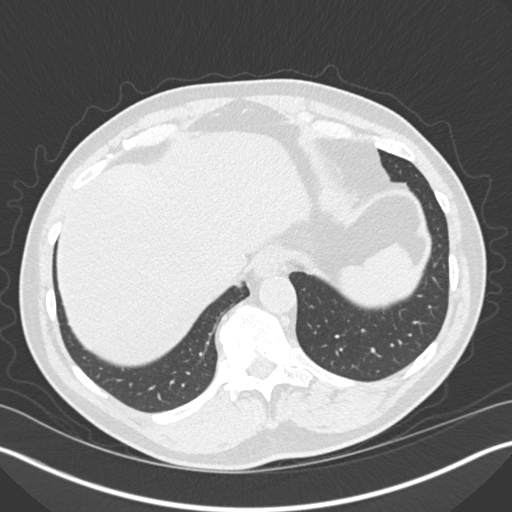
[im 62/184  mediastinal]
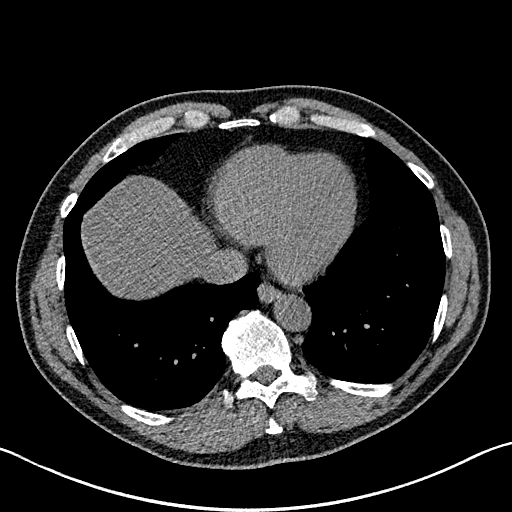
[im 62/184  lung]
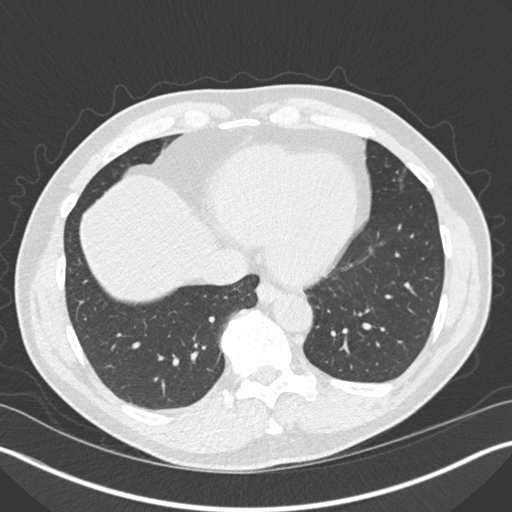
[im 74/184  lung]
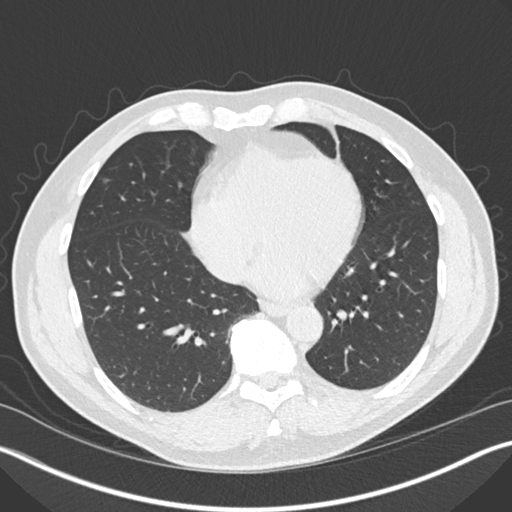
[im 82/184  lung]
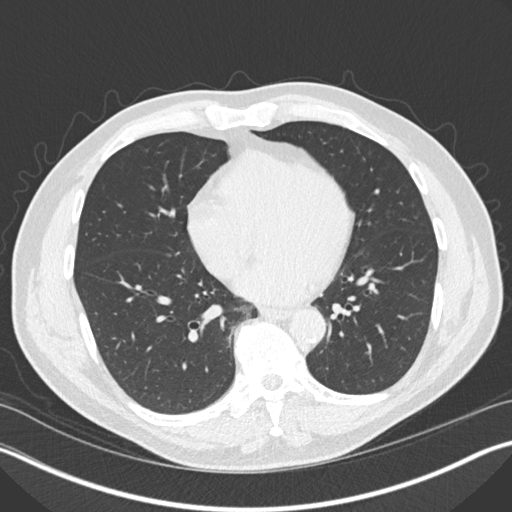
[im 95/184  lung]
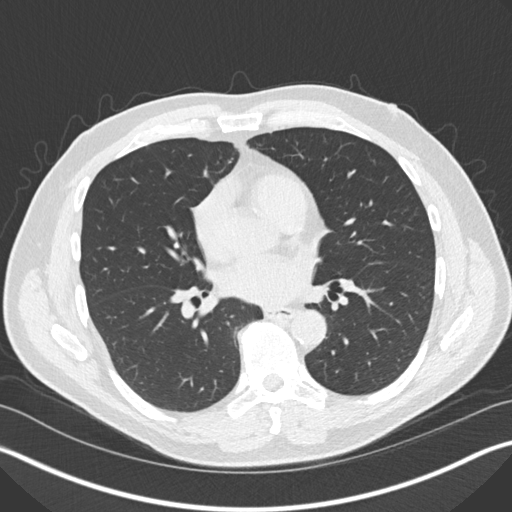
[im 102/184  mediastinal]
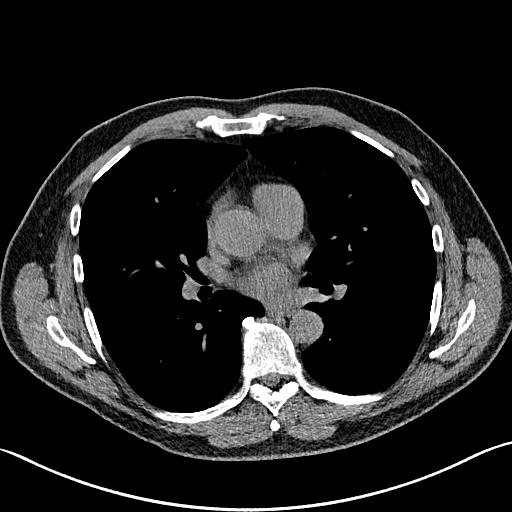
[im 102/184  lung]
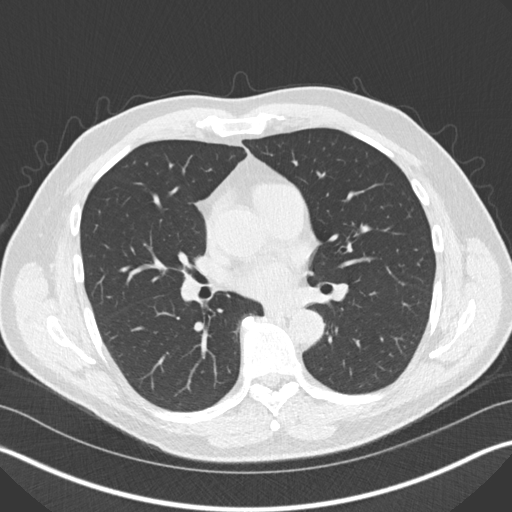
[im 110/184  lung]
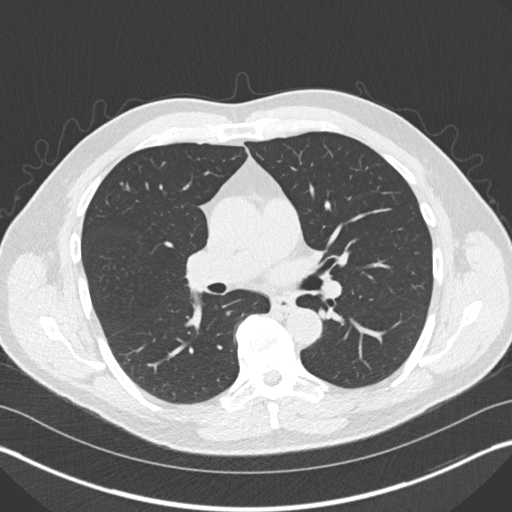
[im 123/184  lung]
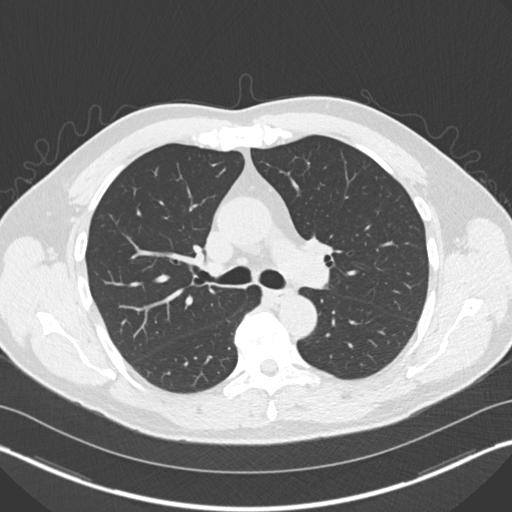
[im 136/184  lung]
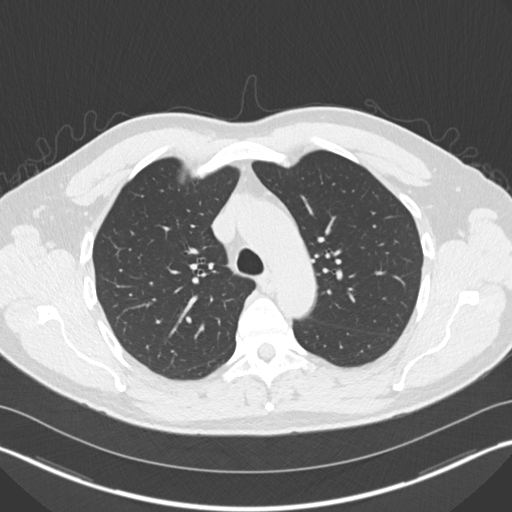
[im 147/184  mediastinal]
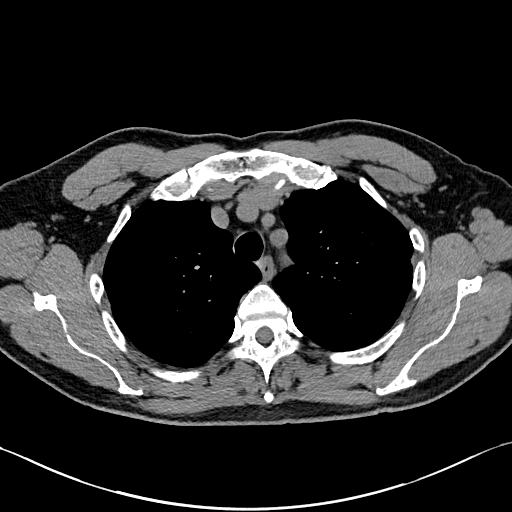
[im 147/184  lung]
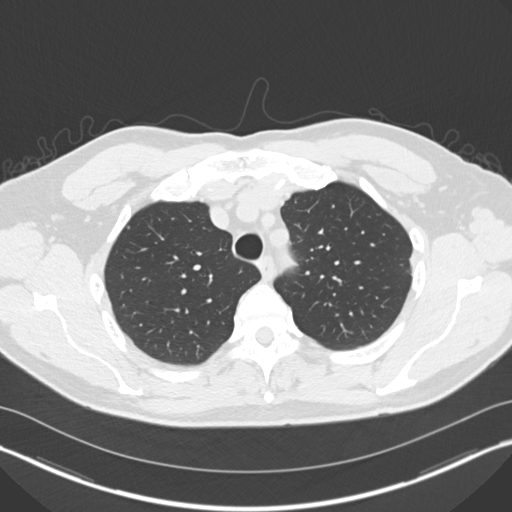
[im 156/184  lung]
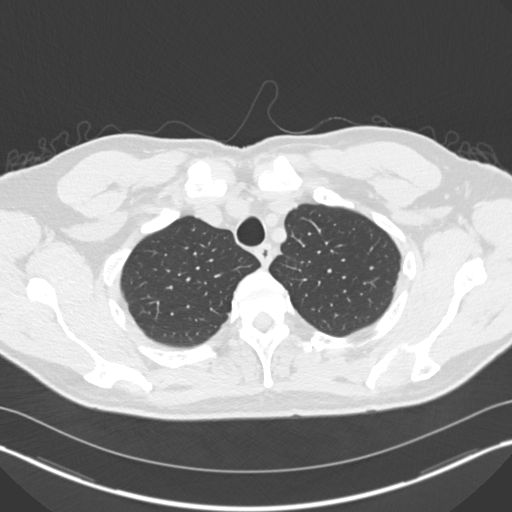
[im 170/184  lung]
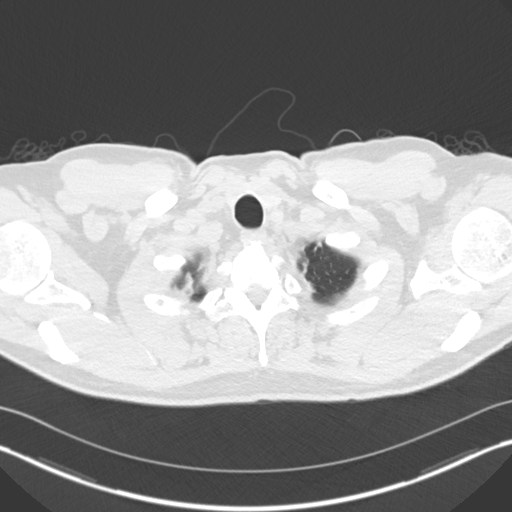

[15 of 34 positions shown; findings below may reference images not displayed]

FINDINGS: Cardiovascular: Normal heart size. No pericardial effusion. Normal
caliber thoracic aorta with mild calcific atherosclerosis. Mild
coronary artery calcification in LAD.

Mediastinum/Nodes: No enlarged mediastinal or axillary lymph nodes.
Thyroid gland, trachea, and esophagus demonstrate no significant
findings.

Lungs/Pleura: There are several scattered calcified granuloma.
Additionally, there is a 6 mm left lower lobe nodule (series 3,
image 134) and 3 mm right lower lobe nodule (series 3, image 94).
Small group of nodules in bronchovascular distribution in left lung
apex probably related to small airway mucous impaction.

Upper Abdomen: Hepatic steatosis. Gallstones. 19 mm splenule in the
splenic hilum.

Musculoskeletal: No chest wall mass or suspicious bone lesions
identified.
IMPRESSION: 1. Several scattered calcified granuloma. Few additional pulmonary
nodules measuring up to 6 mm in the left lower lobe. Non-contrast
chest CT at 3-6 months is recommended. If the nodules are stable at
time of repeat CT, then future CT at 18-24 months (from today's
scan) is considered optional for low-risk patients, but is
recommended for high-risk patients. This recommendation follows the
consensus statement: Guidelines for Management of Incidental
Pulmonary Nodules Detected on CT Images: From the [HOSPITAL]
2. Hepatic steatosis and cholelithiasis.
3. Mild aortic and coronary artery calcification.

By: John Federico Menor M.D.

## 2017-08-28 DIAGNOSIS — M5430 Sciatica, unspecified side: Secondary | ICD-10-CM | POA: Diagnosis not present

## 2017-08-28 DIAGNOSIS — Z882 Allergy status to sulfonamides status: Secondary | ICD-10-CM | POA: Diagnosis not present

## 2017-08-28 DIAGNOSIS — E785 Hyperlipidemia, unspecified: Secondary | ICD-10-CM | POA: Diagnosis not present

## 2017-08-28 DIAGNOSIS — N201 Calculus of ureter: Secondary | ICD-10-CM | POA: Diagnosis not present

## 2017-08-28 DIAGNOSIS — K219 Gastro-esophageal reflux disease without esophagitis: Secondary | ICD-10-CM | POA: Diagnosis not present

## 2017-08-28 DIAGNOSIS — N200 Calculus of kidney: Secondary | ICD-10-CM | POA: Diagnosis not present

## 2017-08-28 DIAGNOSIS — E7800 Pure hypercholesterolemia, unspecified: Secondary | ICD-10-CM | POA: Diagnosis not present

## 2017-08-28 DIAGNOSIS — Z960 Presence of urogenital implants: Secondary | ICD-10-CM | POA: Diagnosis not present

## 2017-08-28 DIAGNOSIS — N132 Hydronephrosis with renal and ureteral calculous obstruction: Secondary | ICD-10-CM | POA: Diagnosis not present

## 2017-08-28 DIAGNOSIS — C9240 Acute promyelocytic leukemia, not having achieved remission: Secondary | ICD-10-CM | POA: Diagnosis not present

## 2017-08-28 DIAGNOSIS — Z7982 Long term (current) use of aspirin: Secondary | ICD-10-CM | POA: Diagnosis not present

## 2017-08-28 DIAGNOSIS — Z881 Allergy status to other antibiotic agents status: Secondary | ICD-10-CM | POA: Diagnosis not present

## 2017-08-28 DIAGNOSIS — Z79891 Long term (current) use of opiate analgesic: Secondary | ICD-10-CM | POA: Diagnosis not present

## 2017-08-28 DIAGNOSIS — Z79899 Other long term (current) drug therapy: Secondary | ICD-10-CM | POA: Diagnosis not present

## 2017-08-30 ENCOUNTER — Encounter: Payer: Self-pay | Admitting: Family Medicine

## 2017-09-04 DIAGNOSIS — N200 Calculus of kidney: Secondary | ICD-10-CM | POA: Diagnosis not present

## 2017-09-12 ENCOUNTER — Other Ambulatory Visit: Payer: Self-pay | Admitting: Family Medicine

## 2017-09-15 ENCOUNTER — Encounter: Payer: Self-pay | Admitting: Family Medicine

## 2017-09-26 ENCOUNTER — Encounter: Payer: Self-pay | Admitting: Family Medicine

## 2017-09-28 NOTE — Telephone Encounter (Signed)
Can we get IT involved regarding patient's complaint? Thanks.

## 2017-10-02 DIAGNOSIS — M722 Plantar fascial fibromatosis: Secondary | ICD-10-CM | POA: Diagnosis not present

## 2017-10-02 DIAGNOSIS — M24571 Contracture, right ankle: Secondary | ICD-10-CM | POA: Diagnosis not present

## 2017-10-02 DIAGNOSIS — M24572 Contracture, left ankle: Secondary | ICD-10-CM | POA: Diagnosis not present

## 2017-10-14 DIAGNOSIS — M24572 Contracture, left ankle: Secondary | ICD-10-CM | POA: Diagnosis not present

## 2017-10-14 DIAGNOSIS — M722 Plantar fascial fibromatosis: Secondary | ICD-10-CM | POA: Diagnosis not present

## 2017-10-14 DIAGNOSIS — M24571 Contracture, right ankle: Secondary | ICD-10-CM | POA: Diagnosis not present

## 2017-10-18 DIAGNOSIS — Z87442 Personal history of urinary calculi: Secondary | ICD-10-CM | POA: Diagnosis not present

## 2017-10-18 DIAGNOSIS — Z6827 Body mass index (BMI) 27.0-27.9, adult: Secondary | ICD-10-CM | POA: Diagnosis not present

## 2017-10-18 DIAGNOSIS — N200 Calculus of kidney: Secondary | ICD-10-CM | POA: Diagnosis not present

## 2017-10-30 DIAGNOSIS — M722 Plantar fascial fibromatosis: Secondary | ICD-10-CM | POA: Diagnosis not present

## 2017-10-30 DIAGNOSIS — M24571 Contracture, right ankle: Secondary | ICD-10-CM | POA: Diagnosis not present

## 2017-10-30 DIAGNOSIS — M24572 Contracture, left ankle: Secondary | ICD-10-CM | POA: Diagnosis not present

## 2017-11-06 ENCOUNTER — Encounter: Payer: Self-pay | Admitting: Family Medicine

## 2017-11-08 NOTE — Telephone Encounter (Signed)
fyi pt we discussed

## 2017-11-18 ENCOUNTER — Telehealth: Payer: Self-pay

## 2017-11-18 NOTE — Telephone Encounter (Signed)
Copied from [place] [phone]. Topic: General - Other >> Nov 18, 2017  2:33 PM Mcneil, Ja-Kwan wrote: Reason for CRM: Pt states he spoke with Dr Danise Mina regarding the Shingrix vaccine but he has not heard back from anyone. Pt requests a call back. Cb# [phone]

## 2017-11-18 NOTE — Telephone Encounter (Signed)
Pt said he has to get shingrix at the doctors office for ins to pay. Pt will cb to see when gets shingrix vaccine.

## 2017-11-19 ENCOUNTER — Other Ambulatory Visit (INDEPENDENT_AMBULATORY_CARE_PROVIDER_SITE_OTHER): Payer: BLUE CROSS/BLUE SHIELD

## 2017-11-19 DIAGNOSIS — N138 Other obstructive and reflux uropathy: Secondary | ICD-10-CM

## 2017-11-19 DIAGNOSIS — E782 Mixed hyperlipidemia: Secondary | ICD-10-CM | POA: Diagnosis not present

## 2017-11-19 DIAGNOSIS — N401 Enlarged prostate with lower urinary tract symptoms: Secondary | ICD-10-CM

## 2017-11-20 LAB — LIPID PANEL
CHOL/HDL RATIO: 4
Cholesterol: 150 mg/dL (ref 0–200)
HDL: 41.6 mg/dL (ref >39.00)
LDL Cholesterol: 83 mg/dL (ref 0–99)
NONHDL: 108.53
Triglycerides: 129 mg/dL (ref 0.0–149.0)
VLDL: 25.8 mg/dL (ref 0.0–40.0)

## 2017-11-20 LAB — PSA: PSA: 1.38 ng/mL (ref 0.10–4.00)

## 2017-11-29 ENCOUNTER — Encounter: Payer: Self-pay | Admitting: Family Medicine

## 2017-11-29 ENCOUNTER — Telehealth: Payer: Self-pay | Admitting: Family Medicine

## 2017-11-29 DIAGNOSIS — L570 Actinic keratosis: Secondary | ICD-10-CM | POA: Diagnosis not present

## 2017-11-29 DIAGNOSIS — D0439 Carcinoma in situ of skin of other parts of face: Secondary | ICD-10-CM | POA: Diagnosis not present

## 2017-11-29 DIAGNOSIS — L820 Inflamed seborrheic keratosis: Secondary | ICD-10-CM | POA: Diagnosis not present

## 2017-11-29 DIAGNOSIS — Z85828 Personal history of other malignant neoplasm of skin: Secondary | ICD-10-CM | POA: Diagnosis not present

## 2017-11-29 NOTE — Telephone Encounter (Signed)
I spoke with pt at length. He has an ins plan that requires him to have the shingrix administered thru his Dr only. It will not be covered thru a pharmacy- he will have to pay $200 x 2 out of pocket. He has called around and says he is not understanding why this decision has been made. Is there any way we can help?

## 2017-11-29 NOTE — Telephone Encounter (Signed)
Please call pt  I tried to explain to pt that the providers decided not to give shingles vaccine here because they could not be guarantee to get the 2nd vaccine.  And patients need 2nd injection.  Pt stated that was crazy and we were over paying drug rep.  That he spoke to (I can't remember which pharmacy) if he prepaid for the vaccine they would give him the 1st and inj and they would save the 2nd inj for him.  He stated that [facility] has lots of them.

## 2017-12-04 NOTE — Telephone Encounter (Signed)
I am investigating our options for shingrix with [facility] supply.  We will re-evaluate the supply demand issues and see if it makes sense for our office to move forward with an order.  We are very anxious to receive the Shingrix but want to be sure that we can administer BOTH doses for the best protection of our patients.    We understand his concerns and the monetary difficulties that are arising with insurance companies and will work very hard to support and meet our patient's needs for shingrix in the safest, best possible way.  Jeani Hawking and I discussed this at length and she will plan on follow up with patient as needed.  I will, also, let her know if we are able to start administering shingrix here at [facility].

## 2017-12-06 ENCOUNTER — Other Ambulatory Visit: Payer: Self-pay | Admitting: Family Medicine

## 2017-12-12 ENCOUNTER — Ambulatory Visit: Payer: BLUE CROSS/BLUE SHIELD

## 2017-12-17 NOTE — Telephone Encounter (Signed)
We are working on obtaining a small supply for th office.  Patient has been added to the waiting list and I will contact him as soon as his doses become available.

## 2017-12-19 ENCOUNTER — Telehealth: Payer: Self-pay | Admitting: Family Medicine

## 2017-12-19 ENCOUNTER — Ambulatory Visit (INDEPENDENT_AMBULATORY_CARE_PROVIDER_SITE_OTHER): Payer: BLUE CROSS/BLUE SHIELD

## 2017-12-19 DIAGNOSIS — Z23 Encounter for immunization: Secondary | ICD-10-CM | POA: Diagnosis not present

## 2017-12-19 NOTE — Telephone Encounter (Signed)
Patient wants to be put on the wait list for the Shingryx shot.  Patient said he received the Zostovax shot when he turned 59.  Patient hasn't had the shingles before, but had a cousin that was hospitalized for 8 weeks with the shingles. When you're aware,please call patient to let him know if he can have the shot.

## 2017-12-27 NOTE — Telephone Encounter (Signed)
Patient was placed on the list on 11/29/17.  Thanks we should have a small supply in the very near future.

## 2018-01-21 ENCOUNTER — Ambulatory Visit (INDEPENDENT_AMBULATORY_CARE_PROVIDER_SITE_OTHER): Payer: BLUE CROSS/BLUE SHIELD

## 2018-01-21 DIAGNOSIS — Z23 Encounter for immunization: Secondary | ICD-10-CM | POA: Diagnosis not present

## 2018-01-31 DIAGNOSIS — D169 Benign neoplasm of bone and articular cartilage, unspecified: Secondary | ICD-10-CM | POA: Diagnosis not present

## 2018-01-31 DIAGNOSIS — J329 Chronic sinusitis, unspecified: Secondary | ICD-10-CM | POA: Diagnosis not present

## 2018-02-07 ENCOUNTER — Encounter: Payer: Self-pay | Admitting: Family Medicine

## 2018-02-07 ENCOUNTER — Ambulatory Visit (INDEPENDENT_AMBULATORY_CARE_PROVIDER_SITE_OTHER): Payer: BLUE CROSS/BLUE SHIELD | Admitting: Family Medicine

## 2018-02-07 VITALS — BP 130/58 | HR 85 | Temp 98.8°F | Ht 74.0 in | Wt 217.0 lb

## 2018-02-07 DIAGNOSIS — J019 Acute sinusitis, unspecified: Secondary | ICD-10-CM

## 2018-02-07 MED ORDER — TRAMADOL HCL 50 MG PO TABS: ORAL_TABLET | ORAL | 0 refills | Status: DC

## 2018-02-07 MED ORDER — GUAIFENESIN-CODEINE 100-10 MG/5ML PO SYRP: 5.0000 mL | ORAL_SOLUTION | Freq: Two times a day (BID) | ORAL | 0 refills | Status: DC | PRN

## 2018-02-07 NOTE — Assessment & Plan Note (Addendum)
Anticipate viral given short duration.  Supportive care reviewed. Update if not improving with treatment to consider abx course. He declines steroid course today.  cheratussin for night time cough.

## 2018-02-07 NOTE — Progress Notes (Signed)
BP (!) 130/58 (BP Location: Right Arm, Cuff Size: Large)   Pulse 85   Temp 98.8 F (37.1 C) (Oral)   Ht 6\' 2"  (1.88 m)   Wt 217 lb (98.4 kg)   SpO2 97%   BMI 27.86 kg/m    CC: cough Subjective:    Patient ID: Ethan Austin, male    DOB: 04-May-1955, 62 y.o.   MRN: [id number]  HPI: Ethan Austin is a 62 y.o. male presenting on 02/07/2018 for Cough (C/o productive cough with yellow mucous, bilateral ear popping, chest congestion and sinus drainage. Sxs Started yesterday. Tried nasal rinse. )   5d h/o muffled hearing, congestion, clear mucous. Yesterday noticed increased productive cough of brown/yellow sputum, PNdrainage, watery stools, some ST. Chest > head congestion.   No fevers/chills, nausea, vomiting, abd pain, body aches, ear or tooth pain, dyspnea or wheezing, HA.  Strenuous week at work.  Wife sick at home with common cold.  Non smoker.  Treating with nyquil with benefit.  No h/o asthma or COPD.  H/o recurrent sinus surgeries s/p sinus surgery with removal of osteoma.   BP elevated today - attributed to feeling ill.   Saw ENT for tinnitus - overall benign exam.   Relevant past medical, surgical, family and social history reviewed and updated as indicated. Interim medical history since our last visit reviewed. Allergies and medications reviewed and updated. Outpatient Medications Prior to Visit  Medication Sig Dispense Refill  . aspirin 81 MG tablet Take 81 mg by mouth daily.    Marland Kitchen atorvastatin (LIPITOR) 10 MG tablet TAKE 1 TABLET DAILY AT 6:00 P.M. 90 tablet 3  . cetirizine (ZYRTEC) 10 MG tablet Take 10 mg by mouth 1 day or 1 dose.    . fluticasone (FLONASE) 50 MCG/ACT nasal spray Place 2 sprays into both nostrils 2 (two) times daily.    . NON FORMULARY NEILMED SINUS RINSE    . omega-3 acid ethyl esters (LOVAZA) 1 g capsule TAKE 4 CAPSULES DAILY 360 capsule 1  . traMADol (ULTRAM) 50 MG tablet TAKE 1 TABLET BY MOUTH 2 TIMES A DAY AS NEEDED FOR PAIN 30 tablet 0    . fenofibrate micronized (LOFIBRA) 134 MG capsule TAKE 1 CAPSULE DAILY BEFORE BREAKFAST 90 capsule 3   No facility-administered medications prior to visit.      Per HPI unless specifically indicated in ROS section below Review of Systems     Objective:    BP (!) 130/58 (BP Location: Right Arm, Cuff Size: Large)   Pulse 85   Temp 98.8 F (37.1 C) (Oral)   Ht 6\' 2"  (1.88 m)   Wt 217 lb (98.4 kg)   SpO2 97%   BMI 27.86 kg/m   Wt Readings from Last 3 Encounters:  02/07/18 217 lb (98.4 kg)  05/17/17 220 lb (99.8 kg)  02/15/17 224 lb 12 oz (101.9 kg)    Physical Exam Vitals signs and nursing note reviewed.  Constitutional:      General: He is not in acute distress.    Appearance: He is well-developed.  HENT:     Head: Normocephalic and atraumatic.     Right Ear: Hearing, ear canal and external ear normal. Tympanic membrane is injected.     Left Ear: Hearing, ear canal and external ear normal.     Ears:     Comments: R>L TM mild erythema, fluid behind TM, good light reflex    Nose: Mucosal edema (erythematous nasal mucosa) and congestion present. No  rhinorrhea.     Right Sinus: Maxillary sinus tenderness present. No frontal sinus tenderness.     Left Sinus: Maxillary sinus tenderness present. No frontal sinus tenderness.     Mouth/Throat:     Pharynx: Uvula midline. No oropharyngeal exudate or posterior oropharyngeal erythema.     Tonsils: No tonsillar abscesses.  Eyes:     General: No scleral icterus.    Conjunctiva/sclera: Conjunctivae normal.     Pupils: Pupils are equal, round, and reactive to light.  Neck:     Musculoskeletal: Normal range of motion and neck supple.  Cardiovascular:     Rate and Rhythm: Normal rate and regular rhythm.     Heart sounds: Normal heart sounds. No murmur.  Pulmonary:     Effort: Pulmonary effort is normal. No respiratory distress.     Breath sounds: Normal breath sounds. No wheezing or rales.     Comments: Lungs  clear Lymphadenopathy:     Cervical: No cervical adenopathy.  Skin:    General: Skin is warm and dry.     Findings: No rash.    Results for orders placed or performed in visit on 11/19/17  Lipid panel  Result Value Ref Range   Cholesterol 150 0 - 200 mg/dL   Triglycerides 129.0 0.0 - 149.0 mg/dL   HDL 41.60 >39.00 mg/dL   VLDL 25.8 0.0 - 40.0 mg/dL   LDL Cholesterol 83 0 - 99 mg/dL   Total CHOL/HDL Ratio 4    NonHDL 108.53   PSA  Result Value Ref Range   PSA 1.38 0.10 - 4.00 ng/mL      Assessment & Plan:  Requests tramadol refill for intermittent back pain - last filled 11/2016.  Problem List Items Addressed This Visit    Acute sinusitis - Primary    Anticipate viral given short duration.  Supportive care reviewed. Update if not improving with treatment to consider abx course. He declines steroid course today.  cheratussin for night time cough.       Relevant Medications   guaiFENesin-codeine (CHERATUSSIN AC) 100-10 MG/5ML syrup       Meds ordered this encounter  Medications  . guaiFENesin-codeine (CHERATUSSIN AC) 100-10 MG/5ML syrup    Sig: Take 5 mLs by mouth 2 (two) times daily as needed.    Dispense:  120 mL    Refill:  0  . DISCONTD: traMADol (ULTRAM) 50 MG tablet    Sig: TAKE 1 TABLET BY MOUTH 2 TIMES A DAY AS NEEDED FOR PAIN    Dispense:  30 tablet    Refill:  0    Not to exceed 5 additional fills before 10/06/2014  . traMADol (ULTRAM) 50 MG tablet    Sig: TAKE 1 TABLET BY MOUTH 2 TIMES A DAY AS NEEDED FOR PAIN    Dispense:  30 tablet    Refill:  0    Not to exceed 5 additional fills before 10/06/2014   No orders of the defined types were placed in this encounter.  Patient Instructions  I think you have viral sinusitis  This should improve over next several days - viral sinus infections usually resolve within 7-10 days.  Take medicine as prescribed: cheratussin cough syrup.  Push fluids and plenty of rest. Nasal saline irrigation or neti pot to  help drain sinuses. May use plain mucinex with plenty of fluid to help mobilize mucous. May continue nyquil as needed. Continue flonase.  If ongoing symptoms past 10 days, fever >101, worsening productive cough, let us  know.    Follow up plan: Return if symptoms worsen or fail to improve.  Ria Bush, MD

## 2018-02-07 NOTE — Patient Instructions (Addendum)
I think you have viral sinusitis  This should improve over next several days - viral sinus infections usually resolve within 7-10 days.  Take medicine as prescribed: cheratussin cough syrup.  Push fluids and plenty of rest. Nasal saline irrigation or neti pot to help drain sinuses. May use plain mucinex with plenty of fluid to help mobilize mucous. May continue nyquil as needed. Continue flonase.  If ongoing symptoms past 10 days, fever >101, worsening productive cough, let us know.

## 2018-02-16 ENCOUNTER — Encounter: Payer: Self-pay | Admitting: Family Medicine

## 2018-02-17 MED ORDER — AMOXICILLIN-POT CLAVULANATE 875-125 MG PO TABS: 1.0000 | ORAL_TABLET | Freq: Two times a day (BID) | ORAL | 0 refills | Status: AC

## 2018-02-24 DIAGNOSIS — R040 Epistaxis: Secondary | ICD-10-CM | POA: Diagnosis not present

## 2018-02-24 DIAGNOSIS — J3489 Other specified disorders of nose and nasal sinuses: Secondary | ICD-10-CM | POA: Diagnosis not present

## 2018-02-24 DIAGNOSIS — E785 Hyperlipidemia, unspecified: Secondary | ICD-10-CM | POA: Diagnosis not present

## 2018-02-24 DIAGNOSIS — Z6826 Body mass index (BMI) 26.0-26.9, adult: Secondary | ICD-10-CM | POA: Diagnosis not present

## 2018-02-24 DIAGNOSIS — Z9889 Other specified postprocedural states: Secondary | ICD-10-CM | POA: Diagnosis not present

## 2018-02-24 DIAGNOSIS — E7800 Pure hypercholesterolemia, unspecified: Secondary | ICD-10-CM | POA: Diagnosis not present

## 2018-02-24 DIAGNOSIS — Z6827 Body mass index (BMI) 27.0-27.9, adult: Secondary | ICD-10-CM | POA: Diagnosis not present

## 2018-02-24 DIAGNOSIS — K219 Gastro-esophageal reflux disease without esophagitis: Secondary | ICD-10-CM | POA: Diagnosis not present

## 2018-02-25 DIAGNOSIS — J3489 Other specified disorders of nose and nasal sinuses: Secondary | ICD-10-CM | POA: Diagnosis not present

## 2018-02-25 DIAGNOSIS — E785 Hyperlipidemia, unspecified: Secondary | ICD-10-CM | POA: Diagnosis not present

## 2018-02-25 DIAGNOSIS — E7800 Pure hypercholesterolemia, unspecified: Secondary | ICD-10-CM | POA: Diagnosis not present

## 2018-02-25 DIAGNOSIS — Z9889 Other specified postprocedural states: Secondary | ICD-10-CM | POA: Diagnosis not present

## 2018-02-25 DIAGNOSIS — Z8709 Personal history of other diseases of the respiratory system: Secondary | ICD-10-CM | POA: Diagnosis not present

## 2018-02-25 DIAGNOSIS — Z6826 Body mass index (BMI) 26.0-26.9, adult: Secondary | ICD-10-CM | POA: Diagnosis not present

## 2018-02-25 DIAGNOSIS — R040 Epistaxis: Secondary | ICD-10-CM | POA: Diagnosis not present

## 2018-02-25 DIAGNOSIS — K219 Gastro-esophageal reflux disease without esophagitis: Secondary | ICD-10-CM | POA: Diagnosis not present

## 2018-02-25 DIAGNOSIS — J329 Chronic sinusitis, unspecified: Secondary | ICD-10-CM | POA: Diagnosis not present

## 2018-02-26 MED ORDER — OXYCODONE HCL 5 MG PO TABS
5.00 | ORAL_TABLET | ORAL | Status: DC
Start: ? — End: 2018-02-26

## 2018-02-26 MED ORDER — ACETAMINOPHEN 325 MG PO TABS
650.00 | ORAL_TABLET | ORAL | Status: DC
Start: ? — End: 2018-02-26

## 2018-02-26 MED ORDER — OXYMETAZOLINE HCL 0.05 % NA SOLN
3.00 | NASAL | Status: DC
Start: 2018-02-26 — End: 2018-02-26

## 2018-02-26 MED ORDER — ONDANSETRON HCL 4 MG/2ML IJ SOLN
4.00 | INTRAMUSCULAR | Status: DC
Start: ? — End: 2018-02-26

## 2018-02-26 MED ORDER — SALINE NASAL SPRAY 0.65 % NA SOLN
1.00 | NASAL | Status: DC
Start: 2018-02-26 — End: 2018-02-26

## 2018-02-26 MED ORDER — ATORVASTATIN CALCIUM 10 MG PO TABS
10.00 | ORAL_TABLET | ORAL | Status: DC
Start: 2018-02-26 — End: 2018-02-26

## 2018-02-28 DIAGNOSIS — J329 Chronic sinusitis, unspecified: Secondary | ICD-10-CM | POA: Diagnosis not present

## 2018-03-01 ENCOUNTER — Encounter: Payer: Self-pay | Admitting: Family Medicine

## 2018-03-06 ENCOUNTER — Encounter: Payer: Self-pay | Admitting: Family Medicine

## 2018-03-07 ENCOUNTER — Ambulatory Visit (INDEPENDENT_AMBULATORY_CARE_PROVIDER_SITE_OTHER): Payer: BLUE CROSS/BLUE SHIELD | Admitting: Family Medicine

## 2018-03-07 ENCOUNTER — Encounter: Payer: Self-pay | Admitting: Family Medicine

## 2018-03-07 VITALS — BP 132/80 | HR 76 | Temp 98.4°F | Ht 74.0 in | Wt 219.0 lb

## 2018-03-07 DIAGNOSIS — D169 Benign neoplasm of bone and articular cartilage, unspecified: Secondary | ICD-10-CM | POA: Diagnosis not present

## 2018-03-07 DIAGNOSIS — J329 Chronic sinusitis, unspecified: Secondary | ICD-10-CM | POA: Diagnosis not present

## 2018-03-07 DIAGNOSIS — R519 Headache, unspecified: Secondary | ICD-10-CM

## 2018-03-07 DIAGNOSIS — R61 Generalized hyperhidrosis: Secondary | ICD-10-CM | POA: Diagnosis not present

## 2018-03-07 DIAGNOSIS — R51 Headache: Secondary | ICD-10-CM | POA: Diagnosis not present

## 2018-03-07 LAB — CBC WITH DIFFERENTIAL/PLATELET
Absolute Monocytes: 623 cells/uL (ref 200–950)
BASOS PCT: 0.4 %
Basophils Absolute: 30 cells/uL (ref 0–200)
Eosinophils Absolute: 266 cells/uL (ref 15–500)
Eosinophils Relative: 3.5 %
HCT: 39.1 % (ref 38.5–50.0)
HEMOGLOBIN: 13.4 g/dL (ref 13.2–17.1)
Lymphs Abs: 1505 cells/uL (ref 850–3900)
MCH: 29.7 pg (ref 27.0–33.0)
MCHC: 34.3 g/dL (ref 32.0–36.0)
MCV: 86.7 fL (ref 80.0–100.0)
MPV: 9.4 fL (ref 7.5–12.5)
Monocytes Relative: 8.2 %
NEUTROS ABS: 5176 {cells}/uL (ref 1500–7800)
Neutrophils Relative %: 68.1 %
Platelets: 294 10*3/uL (ref 140–400)
RBC: 4.51 10*6/uL (ref 4.20–5.80)
RDW: 12.8 % (ref 11.0–15.0)
Total Lymphocyte: 19.8 %
WBC: 7.6 10*3/uL (ref 3.8–10.8)

## 2018-03-07 LAB — BASIC METABOLIC PANEL
BUN: 15 mg/dL (ref 7–25)
CO2: 29 mmol/L (ref 20–32)
Calcium: 9.6 mg/dL (ref 8.6–10.3)
Chloride: 104 mmol/L (ref 98–110)
Creat: 1.09 mg/dL (ref 0.70–1.25)
Glucose, Bld: 93 mg/dL (ref 65–99)
Potassium: 4.5 mmol/L (ref 3.5–5.3)
SODIUM: 141 mmol/L (ref 135–146)

## 2018-03-07 LAB — TSH: TSH: 0.41 mIU/L (ref 0.40–4.50)

## 2018-03-07 MED ORDER — LEVOFLOXACIN 500 MG PO TABS: 500.0000 mg | ORAL_TABLET | Freq: Every day | ORAL | 0 refills | Status: DC

## 2018-03-07 NOTE — Progress Notes (Signed)
BP 132/80 (BP Location: Right Arm, Patient Position: Sitting, Cuff Size: Large)   Pulse 76   Temp 98.4 F (36.9 C) (Oral)   Ht 6\' 2"  (1.88 m)   Wt 219 lb (99.3 kg)   SpO2 97%   BMI 28.12 kg/m    CC: nose pain, fever.  Subjective:    Patient ID: Ethan Austin, male    DOB: 05-30-1955, 63 y.o.   MRN: [id number]  HPI: Ethan Austin is a 63 y.o. male presenting on 03/07/2018 for Nose Pain (C/o pain since surgery last week. Pt accompanied by his wife, Holley Raring.) and Fever (States he had fever, max 99, on 03/03/18. Also, c/o HA. Has been alternating Tylenol and ibuprofen. )   Seen here 02/07/2018 with dx acute viral sinusitis, as no improvement over the next week he was treated with augmentin 10d course. No significant improvement with this, actually ended up in [facility] ER with L sided nosebleed that didn't improve with afrin, pressure, TXA, treated with rhino rocket anterior nasal packing and placed on augmentin XR 2 daily for 10 days (didn't take this). Presented later that day to [facility] ER with ongoing bleeding needing cauterization with ENT in OR. Told had friable vessels in frontal sinuses. CT showed hemorrhage bilateral maxillary sinuses. CBC normal 02/25/2018.   Saw Dr Mardee Postin in f/u last Friday, note reviewed. Overall stable report by ENT.  Returned to work on Monday.  Tuesday started feeling worse again - Tmax 99.1, L facial pain, entire teeth/jaw hurt, facial erythema, night sweats for last 9 days (since surgery). Ongoing sinus pressure.   No ST, PNdrainage, cough, body aches, nasuea/vomiting, bowel changes, abd pain, dysphagia, globus sensation, unexpected weight changes. Good appetite.   Trouble sleeping - max 3-4 hours.  H/o bilateral frontal sinusotomy, total ethmoidectomy, sphenoid and maxillary mucous membrane removal, and resection of L skull base osteoma 2017.  Lab Results  Component Value Date   TSH 0.41 03/07/2018        Relevant past medical, surgical, family  and social history reviewed and updated as indicated. Interim medical history since our last visit reviewed. Allergies and medications reviewed and updated. Outpatient Medications Prior to Visit  Medication Sig Dispense Refill  . atorvastatin (LIPITOR) 10 MG tablet TAKE 1 TABLET DAILY AT 6:00 P.M. 90 tablet 3  . cetirizine (ZYRTEC) 10 MG tablet Take 10 mg by mouth 1 day or 1 dose.    . NON FORMULARY daily. NEILMED SINUS RINSE    . omega-3 acid ethyl esters (LOVAZA) 1 g capsule TAKE 4 CAPSULES DAILY 360 capsule 1  . traMADol (ULTRAM) 50 MG tablet TAKE 1 TABLET BY MOUTH 2 TIMES A DAY AS NEEDED FOR PAIN 30 tablet 0  . aspirin 81 MG tablet Take 81 mg by mouth daily.    . fluticasone (FLONASE) 50 MCG/ACT nasal spray Place 2 sprays into both nostrils 2 (two) times daily.    Marland Kitchen guaiFENesin-codeine (CHERATUSSIN AC) 100-10 MG/5ML syrup Take 5 mLs by mouth 2 (two) times daily as needed. 120 mL 0   No facility-administered medications prior to visit.      Per HPI unless specifically indicated in ROS section below Review of Systems Objective:    BP 132/80 (BP Location: Right Arm, Patient Position: Sitting, Cuff Size: Large)   Pulse 76   Temp 98.4 F (36.9 C) (Oral)   Ht 6\' 2"  (1.88 m)   Wt 219 lb (99.3 kg)   SpO2 97%   BMI 28.12 kg/m  Wt Readings from Last 3 Encounters:  03/07/18 219 lb (99.3 kg)  02/07/18 217 lb (98.4 kg)  05/17/17 220 lb (99.8 kg)    Physical Exam Vitals signs and nursing note reviewed.  Constitutional:      General: He is not in acute distress.    Appearance: He is well-developed.  HENT:     Head: Normocephalic and atraumatic.     Right Ear: Hearing, tympanic membrane, ear canal and external ear normal.     Left Ear: Hearing, tympanic membrane, ear canal and external ear normal.     Nose: Mucosal edema present. No congestion or rhinorrhea.     Right Sinus: No maxillary sinus tenderness or frontal sinus tenderness.     Left Sinus: Maxillary sinus tenderness and  frontal sinus tenderness present.     Comments: Nasal septal perforation, chronic    Mouth/Throat:     Mouth: Mucous membranes are moist.     Pharynx: Oropharynx is clear. Uvula midline. No oropharyngeal exudate or posterior oropharyngeal erythema.     Tonsils: No tonsillar abscesses.  Eyes:     General: No scleral icterus.    Conjunctiva/sclera: Conjunctivae normal.     Pupils: Pupils are equal, round, and reactive to light.  Neck:     Musculoskeletal: Normal range of motion and neck supple.  Cardiovascular:     Rate and Rhythm: Normal rate and regular rhythm.     Pulses: Normal pulses.     Heart sounds: Normal heart sounds. No murmur.  Pulmonary:     Effort: Pulmonary effort is normal. No respiratory distress.     Breath sounds: Normal breath sounds. No wheezing or rales.  Lymphadenopathy:     Cervical: No cervical adenopathy.  Skin:    General: Skin is warm and dry.     Findings: No rash.  Psychiatric:        Mood and Affect: Mood normal.       Results for orders placed or performed in visit on 03/07/18  CBC with Differential/Platelet  Result Value Ref Range   WBC 7.6 3.8 - 10.8 Thousand/uL   RBC 4.51 4.20 - 5.80 Million/uL   Hemoglobin 13.4 13.2 - 17.1 g/dL   HCT 39.1 38.5 - 50.0 %   MCV 86.7 80.0 - 100.0 fL   MCH 29.7 27.0 - 33.0 pg   MCHC 34.3 32.0 - 36.0 g/dL   RDW 12.8 11.0 - 15.0 %   Platelets 294 140 - 400 Thousand/uL   MPV 9.4 7.5 - 12.5 fL   Neutro Abs 5,176 1,500 - 7,800 cells/uL   Lymphs Abs 1,505 850 - 3,900 cells/uL   Absolute Monocytes 623 200 - 950 cells/uL   Eosinophils Absolute 266 15 - 500 cells/uL   Basophils Absolute 30 0 - 200 cells/uL   Neutrophils Relative % 68.1 %   Total Lymphocyte 19.8 %   Monocytes Relative 8.2 %   Eosinophils Relative 3.5 %   Basophils Relative 0.4 %  Basic metabolic panel  Result Value Ref Range   Glucose, Bld 93 65 - 99 mg/dL   BUN 15 7 - 25 mg/dL   Creat 1.09 0.70 - 1.25 mg/dL   BUN/Creatinine Ratio NOT  APPLICABLE 6 - 22 (calc)   Sodium 141 135 - 146 mmol/L   Potassium 4.5 3.5 - 5.3 mmol/L   Chloride 104 98 - 110 mmol/L   CO2 29 20 - 32 mmol/L   Calcium 9.6 8.6 - 10.3 mg/dL  TSH  Result Value Ref  Range   TSH 0.41 0.40 - 4.50 mIU/L   Assessment & Plan:   Problem List Items Addressed This Visit    Osteoma    H/o chronic sinusitis and osteitis.  Somewhat confusing history - reviewing records it seems he had an osteoma resected during initial sinus surgery 2017 (from L skull base) - pt agrees. However on recent ENT note as well as CT scan there is mention of nearly obstructive L frontal sinus osteoma, CT specifically mentions "unchanged osteoma in the left frontal sinus" - pt unaware of this. I encouraged he clarify with ENT at upcoming appt.       Night sweats    New - check labs including CBC, TSH.      Relevant Orders   CBC with Differential/Platelet (Completed)   Basic metabolic panel (Completed)   TSH (Completed)   Left facial pressure and pain - Primary    Concern for symptoms of recurrent sinus infection in setting of recent nasal intervention for severe epistaxis. Has f/u with ENT next week. I did provide with WASP for levaquin with reasons to fill (fever >101, worsening sinus pressure/pain) and encouraged ENT f/u.       Chronic rhinosinusitis   Relevant Medications   levofloxacin (LEVAQUIN) 500 MG tablet       Meds ordered this encounter  Medications  . levofloxacin (LEVAQUIN) 500 MG tablet    Sig: Take 1 tablet (500 mg total) by mouth daily.    Dispense:  7 tablet    Refill:  0   Orders Placed This Encounter  Procedures  . CBC with Differential/Platelet  . Basic metabolic panel  . TSH    Patient Instructions  Labs today I think symptoms are likely coming from sinuses  Push fluids and rest.  Take tylenol for discomfort.  If not improving over weekend, fill levaquin antibiotic printed out today.    Follow up plan: No follow-ups on file.  Ria Bush, MD

## 2018-03-07 NOTE — Telephone Encounter (Signed)
Appt scheduled 2:45pm.

## 2018-03-07 NOTE — Patient Instructions (Addendum)
Labs today I think symptoms are likely coming from sinuses  Push fluids and rest.  Take tylenol for discomfort.  If not improving over weekend, fill levaquin antibiotic printed out today.

## 2018-03-09 DIAGNOSIS — R51 Headache: Principal | ICD-10-CM

## 2018-03-09 DIAGNOSIS — R519 Headache, unspecified: Secondary | ICD-10-CM | POA: Insufficient documentation

## 2018-03-09 DIAGNOSIS — R61 Generalized hyperhidrosis: Secondary | ICD-10-CM | POA: Insufficient documentation

## 2018-03-09 NOTE — Assessment & Plan Note (Signed)
New - check labs including CBC, TSH.

## 2018-03-09 NOTE — Assessment & Plan Note (Signed)
Concern for symptoms of recurrent sinus infection in setting of recent nasal intervention for severe epistaxis. Has f/u with ENT next week. I did provide with WASP for levaquin with reasons to fill (fever >101, worsening sinus pressure/pain) and encouraged ENT f/u.

## 2018-03-09 NOTE — Assessment & Plan Note (Addendum)
H/o chronic sinusitis and osteitis.  Somewhat confusing history - reviewing records it seems he had an osteoma resected during initial sinus surgery 2017 (from L skull base) - pt agrees. However on recent ENT note as well as CT scan there is mention of nearly obstructive L frontal sinus osteoma, CT specifically mentions "unchanged osteoma in the left frontal sinus" - pt unaware of this. I encouraged he clarify with ENT at upcoming appt.

## 2018-03-12 DIAGNOSIS — J329 Chronic sinusitis, unspecified: Secondary | ICD-10-CM | POA: Diagnosis not present

## 2018-03-12 DIAGNOSIS — Z6827 Body mass index (BMI) 27.0-27.9, adult: Secondary | ICD-10-CM | POA: Diagnosis not present

## 2018-03-12 DIAGNOSIS — M26609 Unspecified temporomandibular joint disorder, unspecified side: Secondary | ICD-10-CM | POA: Diagnosis not present

## 2018-03-25 ENCOUNTER — Ambulatory Visit (INDEPENDENT_AMBULATORY_CARE_PROVIDER_SITE_OTHER): Payer: BLUE CROSS/BLUE SHIELD

## 2018-03-25 ENCOUNTER — Telehealth: Payer: Self-pay | Admitting: Family Medicine

## 2018-03-25 DIAGNOSIS — Z23 Encounter for immunization: Secondary | ICD-10-CM | POA: Diagnosis not present

## 2018-03-25 NOTE — Progress Notes (Signed)
Shingrix administered IM to R deltoid, patient tolerated well.

## 2018-03-25 NOTE — Telephone Encounter (Signed)
lvm asking pt to call office to r/s nurse visit this afternoon.

## 2018-04-04 DIAGNOSIS — J329 Chronic sinusitis, unspecified: Secondary | ICD-10-CM | POA: Diagnosis not present

## 2018-04-08 DIAGNOSIS — M722 Plantar fascial fibromatosis: Secondary | ICD-10-CM | POA: Diagnosis not present

## 2018-04-29 DIAGNOSIS — M722 Plantar fascial fibromatosis: Secondary | ICD-10-CM | POA: Diagnosis not present

## 2018-04-29 DIAGNOSIS — M24572 Contracture, left ankle: Secondary | ICD-10-CM | POA: Diagnosis not present

## 2018-04-29 DIAGNOSIS — M24571 Contracture, right ankle: Secondary | ICD-10-CM | POA: Diagnosis not present

## 2018-04-30 ENCOUNTER — Encounter: Payer: Self-pay | Admitting: Family Medicine

## 2018-04-30 MED ORDER — ATORVASTATIN CALCIUM 10 MG PO TABS: ORAL_TABLET | ORAL | 3 refills | Status: DC

## 2018-04-30 NOTE — Telephone Encounter (Signed)
E-scribed atorvastatin refill to [facility]. Notified pt via [place].

## 2018-05-12 ENCOUNTER — Other Ambulatory Visit: Payer: Self-pay | Admitting: Family Medicine

## 2018-05-12 ENCOUNTER — Encounter: Payer: Self-pay | Admitting: Family Medicine

## 2018-05-13 ENCOUNTER — Other Ambulatory Visit: Payer: Self-pay | Admitting: Family Medicine

## 2018-05-13 ENCOUNTER — Other Ambulatory Visit (INDEPENDENT_AMBULATORY_CARE_PROVIDER_SITE_OTHER): Payer: BLUE CROSS/BLUE SHIELD

## 2018-05-13 ENCOUNTER — Telehealth: Payer: Self-pay | Admitting: Family Medicine

## 2018-05-13 ENCOUNTER — Other Ambulatory Visit: Payer: Self-pay

## 2018-05-13 DIAGNOSIS — N138 Other obstructive and reflux uropathy: Secondary | ICD-10-CM

## 2018-05-13 DIAGNOSIS — N401 Enlarged prostate with lower urinary tract symptoms: Secondary | ICD-10-CM

## 2018-05-13 DIAGNOSIS — E782 Mixed hyperlipidemia: Secondary | ICD-10-CM | POA: Diagnosis not present

## 2018-05-13 NOTE — Telephone Encounter (Signed)
Best number [phone]  Pt has cpx schedule 3/31 he had labs today.  He wanted to know if there was a way for him to do this via phone or my chart.  He needs this for work

## 2018-05-13 NOTE — Telephone Encounter (Signed)
fyi pt wants to come in for physical next week.  plz let front office know.

## 2018-05-13 NOTE — Telephone Encounter (Signed)
Unable to do physical via phone or webex. Needs actual physical exam.  Would offer to postpone physical office visit 2-3 weeks to April.  I imagine insurance will need to postpone their deadline requirements this year due to extenuating circumstances.

## 2018-05-13 NOTE — Telephone Encounter (Signed)
Noted.  [place] office has been informed.

## 2018-05-14 LAB — COMPREHENSIVE METABOLIC PANEL
ALT: 30 U/L (ref 0–53)
AST: 22 U/L (ref 0–37)
Albumin: 4.5 g/dL (ref 3.5–5.2)
Alkaline Phosphatase: 65 U/L (ref 39–117)
BUN: 12 mg/dL (ref 6–23)
CO2: 31 mEq/L (ref 19–32)
Calcium: 9.6 mg/dL (ref 8.4–10.5)
Chloride: 103 mEq/L (ref 96–112)
Creatinine, Ser: 1.19 mg/dL (ref 0.40–1.50)
GFR: 61.55 mL/min (ref >60.00)
GLUCOSE: 82 mg/dL (ref 70–99)
Potassium: 4.1 mEq/L (ref 3.5–5.1)
Sodium: 141 mEq/L (ref 135–145)
Total Bilirubin: 0.9 mg/dL (ref 0.2–1.2)
Total Protein: 6.8 g/dL (ref 6.0–8.3)

## 2018-05-14 LAB — LIPID PANEL
Cholesterol: 141 mg/dL (ref 0–200)
HDL: 36.3 mg/dL — ABNORMAL LOW (ref >39.00)
LDL Cholesterol: 72 mg/dL (ref 0–99)
NonHDL: 105.16
Total CHOL/HDL Ratio: 4
Triglycerides: 168 mg/dL — ABNORMAL HIGH (ref 0.0–149.0)
VLDL: 33.6 mg/dL (ref 0.0–40.0)

## 2018-05-14 LAB — PSA: PSA: 1.4 ng/mL (ref 0.10–4.00)

## 2018-05-14 NOTE — Telephone Encounter (Signed)
Left message asking pt to call office  °

## 2018-05-14 NOTE — Telephone Encounter (Signed)
Pt r/s to 5/18 °

## 2018-05-20 ENCOUNTER — Encounter: Payer: BLUE CROSS/BLUE SHIELD | Admitting: Family Medicine

## 2018-06-05 ENCOUNTER — Other Ambulatory Visit: Payer: Self-pay | Admitting: Internal Medicine

## 2018-07-04 DIAGNOSIS — D169 Benign neoplasm of bone and articular cartilage, unspecified: Secondary | ICD-10-CM | POA: Diagnosis not present

## 2018-07-04 DIAGNOSIS — Z6827 Body mass index (BMI) 27.0-27.9, adult: Secondary | ICD-10-CM | POA: Diagnosis not present

## 2018-07-04 DIAGNOSIS — J302 Other seasonal allergic rhinitis: Secondary | ICD-10-CM | POA: Diagnosis not present

## 2018-07-07 ENCOUNTER — Encounter: Payer: Self-pay | Admitting: Family Medicine

## 2018-07-07 ENCOUNTER — Ambulatory Visit (INDEPENDENT_AMBULATORY_CARE_PROVIDER_SITE_OTHER): Payer: BLUE CROSS/BLUE SHIELD | Admitting: Family Medicine

## 2018-07-07 ENCOUNTER — Other Ambulatory Visit: Payer: Self-pay

## 2018-07-07 VITALS — BP 132/72 | HR 75 | Temp 98.0°F | Ht 74.25 in | Wt 221.2 lb

## 2018-07-07 DIAGNOSIS — M722 Plantar fascial fibromatosis: Secondary | ICD-10-CM | POA: Diagnosis not present

## 2018-07-07 DIAGNOSIS — Z0000 Encounter for general adult medical examination without abnormal findings: Secondary | ICD-10-CM

## 2018-07-07 DIAGNOSIS — E782 Mixed hyperlipidemia: Secondary | ICD-10-CM | POA: Diagnosis not present

## 2018-07-07 DIAGNOSIS — N401 Enlarged prostate with lower urinary tract symptoms: Secondary | ICD-10-CM | POA: Diagnosis not present

## 2018-07-07 DIAGNOSIS — N138 Other obstructive and reflux uropathy: Secondary | ICD-10-CM

## 2018-07-07 DIAGNOSIS — I700 Atherosclerosis of aorta: Secondary | ICD-10-CM | POA: Diagnosis not present

## 2018-07-07 MED ORDER — TRAMADOL HCL 50 MG PO TABS: ORAL_TABLET | ORAL | 0 refills | Status: AC

## 2018-07-07 NOTE — Assessment & Plan Note (Signed)
DRE/PSA reassuring

## 2018-07-07 NOTE — Assessment & Plan Note (Signed)
Preventative protocols reviewed and updated unless pt declined. Discussed healthy diet and lifestyle.  

## 2018-07-07 NOTE — Assessment & Plan Note (Signed)
Continue aspirin, statin.  

## 2018-07-07 NOTE — Assessment & Plan Note (Signed)
Chronic, stable on current regimen - continue. The 10-year ASCVD risk score Mikey Bussing DC Brooke Bonito., et al., 2013) is: 11.7%   Values used to calculate the score:     Age: 63 years     Sex: Male     Is Non-Hispanic African American: No     Diabetic: No     Tobacco smoker: No     Systolic Blood Pressure: 594 mmHg     Is BP treated: No     HDL Cholesterol: 36.3 mg/dL     Total Cholesterol: 141 mg/dL

## 2018-07-07 NOTE — Progress Notes (Signed)
This visit was conducted in person.  BP 132/72 (BP Location: Left Arm, Patient Position: Sitting, Cuff Size: Normal)   Pulse 75   Temp 98 F (36.7 C) (Oral)   Ht 6' 2.25" (1.886 m)   Wt 221 lb 3 oz (100.3 kg)   SpO2 96%   BMI 28.21 kg/m    CC: CPE Subjective:    Patient ID: Ethan Austin, male    DOB: 05-Mar-1955, 63 y.o.   MRN: [id number]  HPI: SELVIN YUN is a 63 y.o. male presenting on 07/07/2018 for Annual Exam (Has form to be completed.)   Planning to retire at end of month. Wife has already retired.   Had nasal sinus bleed s/p surgical intervention, has backed off flonase, continued nasal saline rinse.   Notes increased fatigue with exertion, no chest pain or dyspnea. No trouble going up flights of stairs.   Preventative: COLONOSCOPY WITH PROPOFOL 08/06/2016 TA, rpt 5 yrs Allen Norris, Darren, MD) Prostate cancer screening - discussed, would like screening. Nocturia x1 with weakening of stream. Fluyearly Tdap - 2014  zostavax 2016  shingrix - 01/2018, 03/2018 Seat belt use discussed Sunscreen use discussed. Sees derm regularly. H/o BCC Non smoker  Alcohol - none  Dentist q6 mo - just got crown placed  Eye exam yearly   Caffeine: 2 cups coffee in am, 1 tea at night Lives with wife and 2 children Occupation: Armed forces logistics/support/administrative officer at [employer]: some college Activity: [employer] with wife, walks regularly  Diet: good water, fruits/vegetables daily      Relevant past medical, surgical, family and social history reviewed and updated as indicated. Interim medical history since our last visit reviewed. Allergies and medications reviewed and updated. Outpatient Medications Prior to Visit  Medication Sig Dispense Refill  . aspirin 81 MG tablet Take 81 mg by mouth daily.    Marland Kitchen atorvastatin (LIPITOR) 10 MG tablet TAKE 1 TABLET DAILY AT 6:00 P.M. 90 tablet 3  . cetirizine (ZYRTEC) 10 MG tablet Take 10 mg by mouth 1 day or 1 dose.    . fluticasone (FLONASE) 50  MCG/ACT nasal spray Place 1 spray into both nostrils 2 (two) times daily.     . NON FORMULARY daily. NEILMED SINUS RINSE    . omega-3 acid ethyl esters (LOVAZA) 1 g capsule TAKE 4 CAPSULES DAILY 360 capsule 1  . omeprazole (PRILOSEC) 20 MG capsule TAKE 1 CAPSULE DAILY 90 capsule 0  . traMADol (ULTRAM) 50 MG tablet TAKE 1 TABLET BY MOUTH 2 TIMES A DAY AS NEEDED FOR PAIN 30 tablet 0   No facility-administered medications prior to visit.      Per HPI unless specifically indicated in ROS section below Review of Systems  Constitutional: Negative for activity change, appetite change, chills, fatigue, fever and unexpected weight change.  HENT: Negative for hearing loss.   Eyes: Negative for visual disturbance.  Respiratory: Negative for cough, chest tightness, shortness of breath and wheezing.   Cardiovascular: Negative for chest pain, palpitations and leg swelling.  Gastrointestinal: Negative for abdominal distention, abdominal pain, blood in stool, constipation, diarrhea, nausea and vomiting.  Genitourinary: Negative for difficulty urinating and hematuria.  Musculoskeletal: Negative for arthralgias, myalgias and neck pain.  Skin: Negative for rash.  Neurological: Negative for dizziness, seizures, syncope and headaches.       Chronic tinnitus  Hematological: Negative for adenopathy. Does not bruise/bleed easily.  Psychiatric/Behavioral: Negative for dysphoric mood. The patient is not nervous/anxious.    Objective:  BP 132/72 (BP Location: Left Arm, Patient Position: Sitting, Cuff Size: Normal)   Pulse 75   Temp 98 F (36.7 C) (Oral)   Ht 6' 2.25" (1.886 m)   Wt 221 lb 3 oz (100.3 kg)   SpO2 96%   BMI 28.21 kg/m   Wt Readings from Last 3 Encounters:  07/07/18 221 lb 3 oz (100.3 kg)  03/07/18 219 lb (99.3 kg)  02/07/18 217 lb (98.4 kg)    Physical Exam Vitals signs and nursing note reviewed.  Constitutional:      General: He is not in acute distress.    Appearance: Normal  appearance. He is well-developed.  HENT:     Head: Normocephalic and atraumatic.     Right Ear: Hearing, tympanic membrane, ear canal and external ear normal.     Left Ear: Hearing, tympanic membrane, ear canal and external ear normal.     Nose: Nose normal.     Mouth/Throat:     Mouth: Mucous membranes are moist.     Pharynx: Uvula midline. No oropharyngeal exudate or posterior oropharyngeal erythema.  Eyes:     General: No scleral icterus.    Conjunctiva/sclera: Conjunctivae normal.     Pupils: Pupils are equal, round, and reactive to light.  Neck:     Musculoskeletal: Normal range of motion and neck supple.     Vascular: No carotid bruit.  Cardiovascular:     Rate and Rhythm: Normal rate and regular rhythm.     Pulses: Normal pulses.          Radial pulses are 2+ on the right side and 2+ on the left side.     Heart sounds: Normal heart sounds. No murmur.  Pulmonary:     Effort: Pulmonary effort is normal. No respiratory distress.     Breath sounds: Normal breath sounds. No wheezing, rhonchi or rales.  Abdominal:     General: Bowel sounds are normal. There is no distension.     Palpations: Abdomen is soft. There is no mass.     Tenderness: There is no abdominal tenderness. There is no guarding or rebound.  Genitourinary:    Prostate: Normal. Not enlarged (20gm), not tender and no nodules present.     Rectum: Normal. No mass, tenderness, anal fissure, external hemorrhoid or internal hemorrhoid. Normal anal tone.     Comments: Cherry angiomas to scrotum L>R Musculoskeletal: Normal range of motion.  Lymphadenopathy:     Cervical: No cervical adenopathy.  Skin:    General: Skin is warm and dry.     Findings: No rash.  Neurological:     Mental Status: He is alert and oriented to person, place, and time.     Comments: CN grossly intact, station and gait intact  Psychiatric:        Mood and Affect: Mood normal.        Behavior: Behavior normal.        Thought Content: Thought  content normal.        Judgment: Judgment normal.       Results for orders placed or performed in visit on 05/13/18  PSA  Result Value Ref Range   PSA 1.40 0.10 - 4.00 ng/mL  Comprehensive metabolic panel  Result Value Ref Range   Sodium 141 135 - 145 mEq/L   Potassium 4.1 3.5 - 5.1 mEq/L   Chloride 103 96 - 112 mEq/L   CO2 31 19 - 32 mEq/L   Glucose, Bld 82 70 - 99 mg/dL  BUN 12 6 - 23 mg/dL   Creatinine, Ser 1.19 0.40 - 1.50 mg/dL   Total Bilirubin 0.9 0.2 - 1.2 mg/dL   Alkaline Phosphatase 65 39 - 117 U/L   AST 22 0 - 37 U/L   ALT 30 0 - 53 U/L   Total Protein 6.8 6.0 - 8.3 g/dL   Albumin 4.5 3.5 - 5.2 g/dL   Calcium 9.6 8.4 - 10.5 mg/dL   GFR 61.55 >60.00 mL/min  Lipid panel  Result Value Ref Range   Cholesterol 141 0 - 200 mg/dL   Triglycerides 168.0 (H) 0.0 - 149.0 mg/dL   HDL 36.30 (L) >39.00 mg/dL   VLDL 33.6 0.0 - 40.0 mg/dL   LDL Cholesterol 72 0 - 99 mg/dL   Total CHOL/HDL Ratio 4    NonHDL 105.16    Assessment & Plan:   Problem List Items Addressed This Visit    Thoracic aorta atherosclerosis (HCC)    Continue aspirin, statin.      Hyperlipidemia    Chronic, stable on current regimen - continue. The 10-year ASCVD risk score Mikey Bussing DC Brooke Bonito., et al., 2013) is: 11.7%   Values used to calculate the score:     Age: 49 years     Sex: Male     Is Non-Hispanic African American: No     Diabetic: No     Tobacco smoker: No     Systolic Blood Pressure: 322 mmHg     Is BP treated: No     HDL Cholesterol: 36.3 mg/dL     Total Cholesterol: 141 mg/dL       Health care maintenance - Primary    Preventative protocols reviewed and updated unless pt declined. Discussed healthy diet and lifestyle.       Benign prostatic hyperplasia with urinary obstruction    DRE/PSA reassuring           Meds ordered this encounter  Medications  . traMADol (ULTRAM) 50 MG tablet    Sig: TAKE 1 TABLET BY MOUTH 2 TIMES A DAY AS NEEDED FOR PAIN    Dispense:  30 tablet     Refill:  0    Not to exceed 5 additional fills before 10/06/2014   No orders of the defined types were placed in this encounter.   Follow up plan: Return in about 1 year (around 07/07/2019) for annual exam, prior fasting for blood work.  Ria Bush, MD

## 2018-07-07 NOTE — Patient Instructions (Signed)
You are doing well today.  Continue current medicines Return as needed or in 1 year for next physical.   Health Maintenance, Male A healthy lifestyle and preventive care is important for your health and wellness. Ask your health care provider about what schedule of regular examinations is right for you. What should I know about weight and diet? Eat a Healthy Diet  Eat plenty of vegetables, fruits, whole grains, low-fat dairy products, and lean protein.  Do not eat a lot of foods high in solid fats, added sugars, or salt.  Maintain a Healthy Weight Regular exercise can help you achieve or maintain a healthy weight. You should:  Do at least 150 minutes of exercise each week. The exercise should increase your heart rate and make you sweat (moderate-intensity exercise).  Do strength-training exercises at least twice a week. Watch Your Levels of Cholesterol and Blood Lipids  Have your blood tested for lipids and cholesterol every 5 years starting at 64 years of age. If you are at high risk for heart disease, you should start having your blood tested when you are 64 years old. You may need to have your cholesterol levels checked more often if: ? Your lipid or cholesterol levels are high. ? You are older than 64 years of age. ? You are at high risk for heart disease. What should I know about cancer screening? Many types of cancers can be detected early and may often be prevented. Lung Cancer  You should be screened every year for lung cancer if: ? You are a current smoker who has smoked for at least 30 years. ? You are a former smoker who has quit within the past 15 years.  Talk to your health care provider about your screening options, when you should start screening, and how often you should be screened. Colorectal Cancer  Routine colorectal cancer screening usually begins at 64 years of age and should be repeated every 5-10 years until you are 64 years old. You may need to be screened  more often if early forms of precancerous polyps or small growths are found. Your health care provider may recommend screening at an earlier age if you have risk factors for colon cancer.  Your health care provider may recommend using home test kits to check for hidden blood in the stool.  A small camera at the end of a tube can be used to examine your colon (sigmoidoscopy or colonoscopy). This checks for the earliest forms of colorectal cancer. Prostate and Testicular Cancer  Depending on your age and overall health, your health care provider may do certain tests to screen for prostate and testicular cancer.  Talk to your health care provider about any symptoms or concerns you have about testicular or prostate cancer. Skin Cancer  Check your skin from head to toe regularly.  Tell your health care provider about any new moles or changes in moles, especially if: ? There is a change in a mole's size, shape, or color. ? You have a mole that is larger than a pencil eraser.  Always use sunscreen. Apply sunscreen liberally and repeat throughout the day.  Protect yourself by wearing long sleeves, pants, a wide-brimmed hat, and sunglasses when outside. What should I know about heart disease, diabetes, and high blood pressure?  If you are 18-39 years of age, have your blood pressure checked every 3-5 years. If you are 40 years of age or older, have your blood pressure checked every year. You should have your   blood pressure measured twice-once when you are at a hospital or clinic, and once when you are not at a hospital or clinic. Record the average of the two measurements. To check your blood pressure when you are not at a hospital or clinic, you can use: ? An automated blood pressure machine at a pharmacy. ? A home blood pressure monitor.  Talk to your health care provider about your target blood pressure.  If you are between 45-79 years old, ask your health care provider if you should take  aspirin to prevent heart disease.  Have regular diabetes screenings by checking your fasting blood sugar level. ? If you are at a normal weight and have a low risk for diabetes, have this test once every three years after the age of 45. ? If you are overweight and have a high risk for diabetes, consider being tested at a younger age or more often.  A one-time screening for abdominal aortic aneurysm (AAA) by ultrasound is recommended for men aged 65-75 years who are current or former smokers. What should I know about preventing infection? Hepatitis B If you have a higher risk for hepatitis B, you should be screened for this virus. Talk with your health care provider to find out if you are at risk for hepatitis B infection. Hepatitis C Blood testing is recommended for:  Everyone born from 1945 through 1965.  Anyone with known risk factors for hepatitis C. Sexually Transmitted Diseases (STDs)  You should be screened each year for STDs including gonorrhea and chlamydia if: ? You are sexually active and are younger than 64 years of age. ? You are older than 64 years of age and your health care provider tells you that you are at risk for this type of infection. ? Your sexual activity has changed since you were last screened and you are at an increased risk for chlamydia or gonorrhea. Ask your health care provider if you are at risk.  Talk with your health care provider about whether you are at high risk of being infected with HIV. Your health care provider may recommend a prescription medicine to help prevent HIV infection. What else can I do?  Schedule regular health, dental, and eye exams.  Stay current with your vaccines (immunizations).  Do not use any tobacco products, such as cigarettes, chewing tobacco, and e-cigarettes. If you need help quitting, ask your health care provider.  Limit alcohol intake to no more than 2 drinks per day. One drink equals 12 ounces of beer, 5 ounces of  wine, or 1 ounces of hard liquor.  Do not use street drugs.  Do not share needles.  Ask your health care provider for help if you need support or information about quitting drugs.  Tell your health care provider if you often feel depressed.  Tell your health care provider if you have ever been abused or do not feel safe at home. This information is not intended to replace advice given to you by your health care provider. Make sure you discuss any questions you have with your health care provider. Document Released: 08/04/2007 Document Revised: 10/05/2015 Document Reviewed: 11/09/2014 Elsevier Interactive Patient Education  2019 [employer]vier Inc.  

## 2018-08-11 ENCOUNTER — Ambulatory Visit: Payer: Self-pay | Admitting: Obstetrics and Gynecology

## 2018-09-12 ENCOUNTER — Other Ambulatory Visit: Payer: Self-pay | Admitting: Internal Medicine

## 2018-12-02 ENCOUNTER — Ambulatory Visit (INDEPENDENT_AMBULATORY_CARE_PROVIDER_SITE_OTHER): Payer: BC Managed Care – PPO

## 2018-12-02 DIAGNOSIS — Z23 Encounter for immunization: Secondary | ICD-10-CM | POA: Diagnosis not present

## 2018-12-05 DIAGNOSIS — L820 Inflamed seborrheic keratosis: Secondary | ICD-10-CM | POA: Diagnosis not present

## 2018-12-05 DIAGNOSIS — D229 Melanocytic nevi, unspecified: Secondary | ICD-10-CM | POA: Diagnosis not present

## 2018-12-05 DIAGNOSIS — L570 Actinic keratosis: Secondary | ICD-10-CM | POA: Diagnosis not present

## 2018-12-05 DIAGNOSIS — L821 Other seborrheic keratosis: Secondary | ICD-10-CM | POA: Diagnosis not present

## 2018-12-31 DIAGNOSIS — J329 Chronic sinusitis, unspecified: Secondary | ICD-10-CM | POA: Diagnosis not present

## 2018-12-31 DIAGNOSIS — D169 Benign neoplasm of bone and articular cartilage, unspecified: Secondary | ICD-10-CM | POA: Diagnosis not present

## 2018-12-31 DIAGNOSIS — H9190 Unspecified hearing loss, unspecified ear: Secondary | ICD-10-CM | POA: Diagnosis not present

## 2018-12-31 DIAGNOSIS — J302 Other seasonal allergic rhinitis: Secondary | ICD-10-CM | POA: Diagnosis not present

## 2019-01-30 ENCOUNTER — Other Ambulatory Visit: Payer: Self-pay

## 2019-01-30 DIAGNOSIS — Z20822 Contact with and (suspected) exposure to covid-19: Secondary | ICD-10-CM

## 2019-01-31 ENCOUNTER — Encounter: Payer: Self-pay | Admitting: Family Medicine

## 2019-01-31 LAB — NOVEL CORONAVIRUS, NAA: SARS-CoV-2, NAA: DETECTED — AB

## 2019-02-02 NOTE — Telephone Encounter (Signed)
Spoke with patient over the phone.  Symptom onset: ~01/19/2019.  Mild nasal congestion treating with pseudophed.   Mandy - test returned positive Sat 12/12, I never received results but pt states he was called by HD to notify him of test result. What do we need to do differently to ensure PCP receives results?

## 2019-02-03 DIAGNOSIS — H903 Sensorineural hearing loss, bilateral: Secondary | ICD-10-CM | POA: Diagnosis not present

## 2019-02-04 ENCOUNTER — Telehealth: Payer: Self-pay | Admitting: Family Medicine

## 2019-02-04 NOTE — Telephone Encounter (Signed)
Spoke with pt for an update.  States he feels about the same.  C/o sinus drainage and occasional cough.  Taking Sudafed and doing nasal saline rinse which seem to be helpful.  Also, c/o some mild upper chest congestion.  Taking Mucinex.  Fyi to Dr. Darnell Level.

## 2019-02-04 NOTE — Telephone Encounter (Signed)
Positive covid patient, date of onset of symptoms ~01/19/2019. plz call today or tomorrow for update on symptoms.  Also get update on wife (hospitalized at [facility] for covid).

## 2019-02-04 NOTE — Telephone Encounter (Signed)
Noted  

## 2019-02-06 NOTE — Telephone Encounter (Signed)
Symptoms since 01/19/2019. plz call Monday for update on symptoms for patient and wife. If stable to improved, ok to stop following.

## 2019-02-06 NOTE — Telephone Encounter (Signed)
Noted  

## 2019-02-10 NOTE — Telephone Encounter (Signed)
Spoke with pt asking for an update.  States he is about the same.  Still having drainage.  Says he has this issue this time every year.  Sometimes more, sometimes less depending on the weather.  Fyi to Dr. Darnell Level.

## 2019-03-12 NOTE — Telephone Encounter (Signed)
I have shared feedback regarding never receiving and delay of receipt of results to the [facility] for further investigation.  I will continue to work with them on this to hopefully close this care gap and understand what may be occurring.  As I receive more information, I will make you aware.   Thanks.

## 2019-05-12 ENCOUNTER — Other Ambulatory Visit: Payer: Self-pay | Admitting: Family Medicine

## 2019-05-12 DIAGNOSIS — N138 Other obstructive and reflux uropathy: Secondary | ICD-10-CM

## 2019-05-12 DIAGNOSIS — J310 Chronic rhinitis: Secondary | ICD-10-CM | POA: Diagnosis not present

## 2019-05-12 DIAGNOSIS — E782 Mixed hyperlipidemia: Secondary | ICD-10-CM

## 2019-05-12 DIAGNOSIS — N401 Enlarged prostate with lower urinary tract symptoms: Secondary | ICD-10-CM

## 2019-05-12 DIAGNOSIS — Z6827 Body mass index (BMI) 27.0-27.9, adult: Secondary | ICD-10-CM | POA: Diagnosis not present

## 2019-05-14 ENCOUNTER — Other Ambulatory Visit (INDEPENDENT_AMBULATORY_CARE_PROVIDER_SITE_OTHER): Payer: BC Managed Care – PPO

## 2019-05-14 ENCOUNTER — Other Ambulatory Visit: Payer: Self-pay

## 2019-05-14 DIAGNOSIS — E782 Mixed hyperlipidemia: Secondary | ICD-10-CM | POA: Diagnosis not present

## 2019-05-14 DIAGNOSIS — N401 Enlarged prostate with lower urinary tract symptoms: Secondary | ICD-10-CM

## 2019-05-14 DIAGNOSIS — N138 Other obstructive and reflux uropathy: Secondary | ICD-10-CM

## 2019-05-14 LAB — COMPREHENSIVE METABOLIC PANEL
ALT: 30 U/L (ref 0–53)
AST: 29 U/L (ref 0–37)
Albumin: 4.4 g/dL (ref 3.5–5.2)
Alkaline Phosphatase: 59 U/L (ref 39–117)
BUN: 15 mg/dL (ref 6–23)
CO2: 31 mEq/L (ref 19–32)
Calcium: 9.4 mg/dL (ref 8.4–10.5)
Chloride: 104 mEq/L (ref 96–112)
Creatinine, Ser: 1.13 mg/dL (ref 0.40–1.50)
GFR: 65.13 mL/min (ref >60.00)
Glucose, Bld: 88 mg/dL (ref 70–99)
Potassium: 3.8 mEq/L (ref 3.5–5.1)
Sodium: 140 mEq/L (ref 135–145)
Total Bilirubin: 1.7 mg/dL — ABNORMAL HIGH (ref 0.2–1.2)
Total Protein: 6.4 g/dL (ref 6.0–8.3)

## 2019-05-14 LAB — LIPID PANEL
Cholesterol: 133 mg/dL (ref 0–200)
HDL: 34.9 mg/dL — ABNORMAL LOW (ref >39.00)
LDL Cholesterol: 80 mg/dL (ref 0–99)
NonHDL: 97.72
Total CHOL/HDL Ratio: 4
Triglycerides: 91 mg/dL (ref 0.0–149.0)
VLDL: 18.2 mg/dL (ref 0.0–40.0)

## 2019-05-14 LAB — TSH: TSH: 0.64 u[IU]/mL (ref 0.35–4.50)

## 2019-05-14 LAB — PSA: PSA: 1.34 ng/mL (ref 0.10–4.00)

## 2019-05-18 ENCOUNTER — Encounter: Payer: Self-pay | Admitting: Family Medicine

## 2019-05-18 ENCOUNTER — Ambulatory Visit (INDEPENDENT_AMBULATORY_CARE_PROVIDER_SITE_OTHER): Payer: BC Managed Care – PPO | Admitting: Family Medicine

## 2019-05-18 ENCOUNTER — Other Ambulatory Visit: Payer: Self-pay

## 2019-05-18 VITALS — BP 136/70 | HR 72 | Temp 98.0°F | Ht 74.0 in | Wt 213.3 lb

## 2019-05-18 DIAGNOSIS — E782 Mixed hyperlipidemia: Secondary | ICD-10-CM | POA: Diagnosis not present

## 2019-05-18 DIAGNOSIS — I341 Nonrheumatic mitral (valve) prolapse: Secondary | ICD-10-CM

## 2019-05-18 DIAGNOSIS — I2510 Atherosclerotic heart disease of native coronary artery without angina pectoris: Secondary | ICD-10-CM | POA: Diagnosis not present

## 2019-05-18 DIAGNOSIS — Z0000 Encounter for general adult medical examination without abnormal findings: Secondary | ICD-10-CM

## 2019-05-18 DIAGNOSIS — K760 Fatty (change of) liver, not elsewhere classified: Secondary | ICD-10-CM

## 2019-05-18 DIAGNOSIS — I700 Atherosclerosis of aorta: Secondary | ICD-10-CM

## 2019-05-18 DIAGNOSIS — K219 Gastro-esophageal reflux disease without esophagitis: Secondary | ICD-10-CM

## 2019-05-18 DIAGNOSIS — M25511 Pain in right shoulder: Secondary | ICD-10-CM

## 2019-05-18 DIAGNOSIS — Z7189 Other specified counseling: Secondary | ICD-10-CM | POA: Diagnosis not present

## 2019-05-18 DIAGNOSIS — N401 Enlarged prostate with lower urinary tract symptoms: Secondary | ICD-10-CM

## 2019-05-18 DIAGNOSIS — N138 Other obstructive and reflux uropathy: Secondary | ICD-10-CM

## 2019-05-18 MED ORDER — ASPIRIN EC 81 MG PO TBEC: 81.0000 mg | DELAYED_RELEASE_TABLET | ORAL | Status: AC

## 2019-05-18 NOTE — Assessment & Plan Note (Signed)
Murmur heard today, stable.

## 2019-05-18 NOTE — Assessment & Plan Note (Addendum)
Chronic, stable on lipitor - continue.  The 10-year ASCVD risk score Ethan Austin., et al., 2013) is: 12.6%   Values used to calculate the score:     Age: 64 years     Sex: Male     Is Non-Hispanic African American: No     Diabetic: No     Tobacco smoker: No     Systolic Blood Pressure: [id number] mmHg     Is BP treated: No     HDL Cholesterol: 34.9 mg/dL     Total Cholesterol: 133 mg/dL

## 2019-05-18 NOTE — Assessment & Plan Note (Signed)
Preventative protocols reviewed and updated unless pt declined. Discussed healthy diet and lifestyle.  

## 2019-05-18 NOTE — Assessment & Plan Note (Signed)
Continue statin. 

## 2019-05-18 NOTE — Assessment & Plan Note (Signed)
LFTs normal

## 2019-05-18 NOTE — Assessment & Plan Note (Signed)
Advanced directives - doesn't have set up yet - working on this.

## 2019-05-18 NOTE — Patient Instructions (Addendum)
EKG today Try aspirin once weekly. I think you have inflammation/injury of R shoulder rotator cuff - treat with over the counter voltaren gel, and do exercises provided today. If no better, return to see our sports medicine doctor for evaluation.  Continue current medicines. Return for your medicare wellness visit at your convenience.   Health Maintenance, Male Adopting a healthy lifestyle and getting preventive care are important in promoting health and wellness. Ask your health care provider about:  The right schedule for you to have regular tests and exams.  Things you can do on your own to prevent diseases and keep yourself healthy. What should I know about diet, weight, and exercise? Eat a healthy diet   Eat a diet that includes plenty of vegetables, fruits, low-fat dairy products, and lean protein.  Do not eat a lot of foods that are high in solid fats, added sugars, or sodium. Maintain a healthy weight Body mass index (BMI) is a measurement that can be used to identify possible weight problems. It estimates body fat based on height and weight. Your health care provider can help determine your BMI and help you achieve or maintain a healthy weight. Get regular exercise Get regular exercise. This is one of the most important things you can do for your health. Most adults should:  Exercise for at least 150 minutes each week. The exercise should increase your heart rate and make you sweat (moderate-intensity exercise).  Do strengthening exercises at least twice a week. This is in addition to the moderate-intensity exercise.  Spend less time sitting. Even light physical activity can be beneficial. Watch cholesterol and blood lipids Have your blood tested for lipids and cholesterol at 65 years of age, then have this test every 5 years. You may need to have your cholesterol levels checked more often if:  Your lipid or cholesterol levels are high.  You are older than 65 years of  age.  You are at high risk for heart disease. What should I know about cancer screening? Many types of cancers can be detected early and may often be prevented. Depending on your health history and family history, you may need to have cancer screening at various ages. This may include screening for:  Colorectal cancer.  Prostate cancer.  Skin cancer.  Lung cancer. What should I know about heart disease, diabetes, and high blood pressure? Blood pressure and heart disease  High blood pressure causes heart disease and increases the risk of stroke. This is more likely to develop in people who have high blood pressure readings, are of African descent, or are overweight.  Talk with your health care provider about your target blood pressure readings.  Have your blood pressure checked: ? Every 3-5 years if you are 47-12 years of age. ? Every year if you are 84 years old or older.  If you are between the ages of 74 and 47 and are a current or former smoker, ask your health care provider if you should have a one-time screening for abdominal aortic aneurysm (AAA). Diabetes Have regular diabetes screenings. This checks your fasting blood sugar level. Have the screening done:  Once every three years after age 15 if you are at a normal weight and have a low risk for diabetes.  More often and at a younger age if you are overweight or have a high risk for diabetes. What should I know about preventing infection? Hepatitis B If you have a higher risk for hepatitis B, you should be  screened for this virus. Talk with your health care provider to find out if you are at risk for hepatitis B infection. Hepatitis C Blood testing is recommended for:  Everyone born from [birth year] through 1965.  Anyone with known risk factors for hepatitis C. Sexually transmitted infections (STIs)  You should be screened each year for STIs, including gonorrhea and chlamydia, if: ? You are sexually active and are younger  than 65 years of age. ? You are older than 65 years of age and your health care provider tells you that you are at risk for this type of infection. ? Your sexual activity has changed since you were last screened, and you are at increased risk for chlamydia or gonorrhea. Ask your health care provider if you are at risk.  Ask your health care provider about whether you are at high risk for HIV. Your health care provider may recommend a prescription medicine to help prevent HIV infection. If you choose to take medicine to prevent HIV, you should first get tested for HIV. You should then be tested every 3 months for as long as you are taking the medicine. Follow these instructions at home: Lifestyle  Do not use any products that contain nicotine or tobacco, such as cigarettes, e-cigarettes, and chewing tobacco. If you need help quitting, ask your health care provider.  Do not use street drugs.  Do not share needles.  Ask your health care provider for help if you need support or information about quitting drugs. Alcohol use  Do not drink alcohol if your health care provider tells you not to drink.  If you drink alcohol: ? Limit how much you have to 0-2 drinks a day. ? Be aware of how much alcohol is in your drink. In the [place] one drink equals one 12 oz bottle of beer (355 mL), one 5 oz glass of wine (148 mL), or one 1 oz glass of hard liquor (44 mL). General instructions  Schedule regular health, dental, and eye exams.  Stay current with your vaccines.  Tell your health care provider if: ? You often feel depressed. ? You have ever been abused or do not feel safe at home. Summary  Adopting a healthy lifestyle and getting preventive care are important in promoting health and wellness.  Follow your health care provider's instructions about healthy diet, exercising, and getting tested or screened for diseases.  Follow your health care provider's instructions on monitoring your  cholesterol and blood pressure. This information is not intended to replace advice given to you by your health care provider. Make sure you discuss any questions you have with your health care provider. Document Revised: 01/29/2018 Document Reviewed: 01/29/2018 Elsevier Patient Education  2020 [employer].

## 2019-05-18 NOTE — Assessment & Plan Note (Signed)
PSA reassuring, symptoms stable. Continue to monitor.

## 2019-05-18 NOTE — Assessment & Plan Note (Addendum)
Chronic, stable on low dose omeprazole 20mg  daily with good symptom control.

## 2019-05-18 NOTE — Assessment & Plan Note (Addendum)
Mild calcification of mid LAD on chest CT 2018. Reviewed benefits/risks of aspirin therapy - will take once weekly. Continue statin. Update EKG.

## 2019-05-18 NOTE — Assessment & Plan Note (Signed)
Story/exam consistent with RTC injury/tendonitis vs biceps tendonitis. Provided with resistance band and exercises on RTC injury from [facility] pt advisor. rec voltaren gel. Discussed PT vs SM eval if not improving with this.

## 2019-05-18 NOTE — Progress Notes (Signed)
This visit was conducted in person.  BP 136/70 (BP Location: Left Arm, Patient Position: Sitting, Cuff Size: Large)   Pulse 72   Temp 98 F (36.7 C) (Temporal)   Ht 6\' 2"  (1.88 m)   Wt 213 lb 5 oz (96.8 kg)   SpO2 98%   BMI 27.39 kg/m    CC: CPE Subjective:    Patient ID: Ethan Austin, male    DOB: 07/04/1955, 64 y.o.   MRN: [id number]  HPI: Ethan Austin is a 64 y.o. male presenting on 05/18/2019 for Annual Exam (Wants to discuss ASA. )   Saw ENT ([facility]) last week, stable period. 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   Stopped aspirin 11/2018 due to easy bruising/bleeding.   Declines med refills at this time - will let us know when he changes insurance.   Injured R shoulder 2 months ago - reached to grab shelves that seemed to be falling. Worse pain with laying on R side at night when trying to sleep.   Preventative: COLONOSCOPY WITH PROPOFOL 08/06/2016 TA, rpt 5 yrs Allen Norris, Darren, MD)  Prostate cancer screening - discussed, would like screening. Nocturia x1 with weakening of stream. Fluyearly Tdap - 2014  zostavax 2016  shingrix - 01/2018, 03/2018  COVID vaccine - pending availability  Advanced directives - doesn't have set up yet - working on this.  Seat belt use discussed  Sunscreen use discussed.Sees derm regularly.H/o BCC.  Non smoker  Alcohol - none  Dentist q6 mo - just got crown placed  Eye exam yearly   Caffeine: 2 cups coffee in am, 1 tea at night Lives with wife and 2 children Occupation: Armed forces logistics/support/administrative officer at [employer]: some college Activity: cleans church with wife,walks regularly  Diet: good water, fruits/vegetables daily     Relevant past medical, surgical, family and social history reviewed and updated as indicated. Interim medical history since our last visit reviewed. Allergies and medications reviewed and updated. Outpatient Medications Prior to Visit  Medication Sig  Dispense Refill  . atorvastatin (LIPITOR) 10 MG tablet TAKE 1 TABLET DAILY AT 6:00 P.M. 90 tablet 3  . cetirizine (ZYRTEC) 10 MG tablet Take 10 mg by mouth 1 day or 1 dose.    . fluticasone (FLONASE) 50 MCG/ACT nasal spray Place 1 spray into both nostrils 2 (two) times daily.     . NON FORMULARY daily. NEILMED SINUS RINSE    . omega-3 acid ethyl esters (LOVAZA) 1 g capsule TAKE 4 CAPSULES DAILY 360 capsule 1  . omeprazole (PRILOSEC) 20 MG capsule TAKE 1 CAPSULE DAILY 90 capsule 3  . traMADol (ULTRAM) 50 MG tablet TAKE 1 TABLET BY MOUTH 2 TIMES A DAY AS NEEDED FOR PAIN 30 tablet 0  . aspirin 81 MG tablet Take 81 mg by mouth daily.     No facility-administered medications prior to visit.     Per HPI unless specifically indicated in ROS section below Review of Systems  Constitutional: Negative for activity change, appetite change, chills, fatigue, fever and unexpected weight change.  HENT: Negative for hearing loss.   Eyes: Negative for visual disturbance.  Respiratory: Negative for cough, chest tightness, shortness of breath and wheezing.   Cardiovascular: Negative for chest pain, palpitations and leg swelling.  Gastrointestinal: Negative for abdominal distention, abdominal pain, blood in stool, constipation, diarrhea, nausea and vomiting.  Genitourinary: Negative for difficulty urinating and hematuria.  Musculoskeletal: Negative for arthralgias, myalgias and neck pain.  Skin: Negative for rash.  Neurological: Negative for dizziness, seizures, syncope and headaches.  Hematological: Negative for adenopathy. Does not bruise/bleed easily.  Psychiatric/Behavioral: Negative for dysphoric mood. The patient is not nervous/anxious.    Objective:    BP 136/70 (BP Location: Left Arm, Patient Position: Sitting, Cuff Size: Large)   Pulse 72   Temp 98 F (36.7 C) (Temporal)   Ht 6\' 2"  (1.88 m)   Wt 213 lb 5 oz (96.8 kg)   SpO2 98%   BMI 27.39 kg/m   Wt Readings from Last 3 Encounters:   05/18/19 213 lb 5 oz (96.8 kg)  07/07/18 221 lb 3 oz (100.3 kg)  03/07/18 219 lb (99.3 kg)    Physical Exam Vitals and nursing note reviewed.  Constitutional:      General: He is not in acute distress.    Appearance: Normal appearance. He is well-developed. He is not ill-appearing.  HENT:     Head: Normocephalic and atraumatic.     Right Ear: Hearing, tympanic membrane, ear canal and external ear normal.     Left Ear: Hearing, tympanic membrane, ear canal and external ear normal.     Mouth/Throat:     Pharynx: Uvula midline.  Eyes:     General: No scleral icterus.    Extraocular Movements: Extraocular movements intact.     Conjunctiva/sclera: Conjunctivae normal.     Pupils: Pupils are equal, round, and reactive to light.  Cardiovascular:     Rate and Rhythm: Normal rate and regular rhythm.     Pulses: Normal pulses.          Radial pulses are 2+ on the right side and 2+ on the left side.     Heart sounds: Murmur (3/6 systolic at apex) present.  Pulmonary:     Effort: Pulmonary effort is normal. No respiratory distress.     Breath sounds: Normal breath sounds. No wheezing, rhonchi or rales.  Abdominal:     General: Abdomen is flat. Bowel sounds are normal. There is no distension.     Palpations: Abdomen is soft. There is no mass.     Tenderness: There is no abdominal tenderness. There is no guarding or rebound.     Hernia: No hernia is present.  Musculoskeletal:        General: Normal range of motion.     Cervical back: Normal range of motion and neck supple.     Right lower leg: No edema.     Left lower leg: No edema.     Comments:  L shoulder WNL R shoulder exam: No deformity of shoulders on inspection. Discomfort with palpation of anterior shoulder  FROM in abduction and forward flexion. No pain or weakness with testing SITS in ext/int rotation. Discomfort with empty can sign. Discomfort with speed test. No impingement. No pain with rotation of humeral head in [facility]  joint.   Lymphadenopathy:     Cervical: No cervical adenopathy.  Skin:    General: Skin is warm and dry.     Findings: No rash.  Neurological:     General: No focal deficit present.     Mental Status: He is alert and oriented to person, place, and time.     Comments: CN grossly intact, station and gait intact  Psychiatric:        Mood and Affect: Mood normal.        Behavior: Behavior normal.        Thought Content: Thought content normal.  Judgment: Judgment normal.       Results for orders placed or performed in visit on 05/14/19  TSH  Result Value Ref Range   TSH 0.64 0.35 - 4.50 uIU/mL  PSA  Result Value Ref Range   PSA 1.34 0.10 - 4.00 ng/mL  Comprehensive metabolic panel  Result Value Ref Range   Sodium 140 135 - 145 mEq/L   Potassium 3.8 3.5 - 5.1 mEq/L   Chloride 104 96 - 112 mEq/L   CO2 31 19 - 32 mEq/L   Glucose, Bld 88 70 - 99 mg/dL   BUN 15 6 - 23 mg/dL   Creatinine, Ser 1.13 0.40 - 1.50 mg/dL   Total Bilirubin 1.7 (H) 0.2 - 1.2 mg/dL   Alkaline Phosphatase 59 39 - 117 U/L   AST 29 0 - 37 U/L   ALT 30 0 - 53 U/L   Total Protein 6.4 6.0 - 8.3 g/dL   Albumin 4.4 3.5 - 5.2 g/dL   GFR 65.13 >60.00 mL/min   Calcium 9.4 8.4 - 10.5 mg/dL  Lipid panel  Result Value Ref Range   Cholesterol 133 0 - 200 mg/dL   Triglycerides 91.0 0.0 - 149.0 mg/dL   HDL 34.90 (L) >39.00 mg/dL   VLDL 18.2 0.0 - 40.0 mg/dL   LDL Cholesterol 80 0 - 99 mg/dL   Total CHOL/HDL Ratio 4    NonHDL 97.72    EKG - sinus bradycardia rate 50s, normal axis, intervals, no acute ST/T changes.  Assessment & Plan:  This visit occurred during the SARS-CoV-2 public health emergency.  Safety protocols were in place, including screening questions prior to the visit, additional usage of staff PPE, and extensive cleaning of exam room while observing appropriate contact time as indicated for disinfecting solutions.   Problem List Items Addressed This Visit    Thoracic aorta atherosclerosis  ([place])    Continue statin.       Relevant Medications   aspirin EC 81 MG tablet   Right shoulder pain    Story/exam consistent with RTC injury/tendonitis vs biceps tendonitis. Provided with resistance band and exercises on RTC injury from [facility] pt advisor. rec voltaren gel. Discussed PT vs SM eval if not improving with this.       Mitral valve prolapse    Murmur heard today, stable.       Relevant Medications   aspirin EC 81 MG tablet   Hyperlipidemia    Chronic, stable on lipitor - continue.  The 10-year ASCVD risk score Mikey Bussing DC Brooke Bonito., et al., 2013) is: 12.6%   Values used to calculate the score:     Age: 14 years     Sex: Male     Is Non-Hispanic African American: No     Diabetic: No     Tobacco smoker: No     Systolic Blood Pressure: [id number] mmHg     Is BP treated: No     HDL Cholesterol: 34.9 mg/dL     Total Cholesterol: 133 mg/dL       Relevant Medications   aspirin EC 81 MG tablet   Hepatic steatosis    LFTs normal.       Health care maintenance - Primary    Preventative protocols reviewed and updated unless pt declined. Discussed healthy diet and lifestyle.       Gastroesophageal reflux disease    Chronic, stable on low dose omeprazole 20mg  daily with good symptom control.       CAD (coronary artery  disease)    Mild calcification of mid LAD on chest CT 2018. Reviewed benefits/risks of aspirin therapy - will take once weekly. Continue statin. Update EKG.       Relevant Medications   aspirin EC 81 MG tablet   Other Relevant Orders   EKG 12-Lead (Completed)   Benign prostatic hyperplasia with urinary obstruction    PSA reassuring, symptoms stable. Continue to monitor.       Advanced care planning/counseling discussion    Advanced directives - doesn't have set up yet - working on this.           Meds ordered this encounter  Medications  . aspirin EC 81 MG tablet    Sig: Take 1 tablet (81 mg total) by mouth once a week.   Orders Placed This Encounter   Procedures  . EKG 12-Lead    Follow up plan: Return for medicare wellness visit.  Ria Bush, MD

## 2019-07-30 ENCOUNTER — Encounter: Payer: Self-pay | Admitting: Family Medicine

## 2019-07-30 MED ORDER — OMEPRAZOLE 20 MG PO CPDR: 20.0000 mg | DELAYED_RELEASE_CAPSULE | Freq: Every day | ORAL | 3 refills | Status: DC

## 2019-07-30 MED ORDER — ATORVASTATIN CALCIUM 10 MG PO TABS: ORAL_TABLET | ORAL | 3 refills | Status: AC

## 2019-11-12 ENCOUNTER — Telehealth: Payer: Self-pay | Admitting: Family Medicine

## 2019-11-12 NOTE — Telephone Encounter (Signed)
Pt's wife would like a call back regarding her husband getting pneumonia shot.

## 2019-11-13 NOTE — Telephone Encounter (Addendum)
May get pneumovax as he's due.  Will need nurse visit.  Or he can get at next visit.

## 2019-11-13 NOTE — Telephone Encounter (Signed)
Lvm asking pt/pt's wife, Ethan Austin (on dpr) to call back.  Looks like pt is due for pneumonia shot.  Plz advise.

## 2019-11-16 NOTE — Telephone Encounter (Signed)
Spoke with pt relaying Dr. Synthia Innocent message.  Pt verbalizes understanding and will wait to get Pneumovax vaccine.

## 2019-11-18 ENCOUNTER — Ambulatory Visit (INDEPENDENT_AMBULATORY_CARE_PROVIDER_SITE_OTHER): Payer: Medicare Other

## 2019-11-18 ENCOUNTER — Other Ambulatory Visit: Payer: Self-pay

## 2019-11-18 DIAGNOSIS — Z23 Encounter for immunization: Secondary | ICD-10-CM | POA: Diagnosis not present

## 2019-12-29 ENCOUNTER — Ambulatory Visit (INDEPENDENT_AMBULATORY_CARE_PROVIDER_SITE_OTHER): Payer: Medicare Other

## 2019-12-29 DIAGNOSIS — Z23 Encounter for immunization: Secondary | ICD-10-CM | POA: Diagnosis not present

## 2019-12-29 NOTE — Progress Notes (Signed)
Prevnar vaccine given today.  Patient tolerated well.

## 2020-04-14 ENCOUNTER — Encounter: Payer: Self-pay | Admitting: Family Medicine

## 2020-04-14 ENCOUNTER — Telehealth: Payer: Self-pay | Admitting: Family Medicine

## 2020-04-14 MED ORDER — OMEGA-3-ACID ETHYL ESTERS 1 G PO CAPS: 4.0000 | ORAL_CAPSULE | Freq: Every day | ORAL | 0 refills | Status: AC

## 2020-04-14 NOTE — Telephone Encounter (Signed)
Spoke with pt about message.  He wanted to make sure we had his new ins and pharmacy info for future refills.

## 2020-04-14 NOTE — Telephone Encounter (Signed)
E-scribed refill 

## 2020-04-15 NOTE — Telephone Encounter (Signed)
[facility] called in and Checking on medication and they got they fax number

## 2020-04-18 NOTE — Telephone Encounter (Signed)
Pt called in and wanted to know about getting a siliverscript and wanted to know about the prescription and they have 72hr froa response or they are declining it

## 2020-04-19 NOTE — Telephone Encounter (Addendum)
Filled out and in Lisa's box.  

## 2020-04-19 NOTE — Telephone Encounter (Addendum)
Received faxed PA form from [facility] for [facility] request.  States med is non-formulary.  Placed letter in Dr. Synthia Innocent box.

## 2020-04-20 NOTE — Telephone Encounter (Signed)
See recent mychart message

## 2020-04-20 NOTE — Telephone Encounter (Signed)
Faxed form.  Decision pending.  

## 2020-04-21 NOTE — Telephone Encounter (Signed)
Received faxed PA approval, valid 02/20/2020- 04/20/2021.

## 2020-07-16 ENCOUNTER — Telehealth: Payer: Self-pay | Admitting: Family Medicine

## 2020-07-19 NOTE — Telephone Encounter (Signed)
Patient has TOC to another provider with [facility]

## 2020-07-19 NOTE — Telephone Encounter (Signed)
E-scribed refill.  Plz schedule wellness, lab and cpe visits.  

## 2020-07-19 NOTE — Telephone Encounter (Signed)
Noted  

## 2023-04-24 ENCOUNTER — Ambulatory Visit (INDEPENDENT_AMBULATORY_CARE_PROVIDER_SITE_OTHER)

## 2023-04-24 ENCOUNTER — Encounter: Payer: Self-pay | Admitting: Podiatry

## 2023-04-24 ENCOUNTER — Ambulatory Visit (INDEPENDENT_AMBULATORY_CARE_PROVIDER_SITE_OTHER): Admitting: Podiatry

## 2023-04-24 DIAGNOSIS — B351 Tinea unguium: Secondary | ICD-10-CM | POA: Diagnosis not present

## 2023-04-24 DIAGNOSIS — M722 Plantar fascial fibromatosis: Secondary | ICD-10-CM

## 2023-04-24 MED ORDER — TERBINAFINE HCL 250 MG PO TABS: 250.0000 mg | ORAL_TABLET | Freq: Every day | ORAL | 0 refills | Status: AC

## 2023-04-24 NOTE — Progress Notes (Signed)
 Subjective:  Patient ID: Ethan Austin, male    DOB: [birth year],  MRN: [id number]  Chief Complaint  Patient presents with   Nail Problem    "The toenail on my fourth toe has started to curl up.  It doesn't hurt.  I thought I better follow up on it."   Foot Pain    "My heel pain has started to aggravate me a lot for the last couple of months."    Discussed the use of AI scribe software for clinical note transcription with the patient, who gave verbal consent to proceed.  History of Present Illness   DENO SIDA is a 68 year old male with plantar fasciitis who presents with toenail issues and heel pain.  He experiences heel pain in the left foot, attributed to a history of plantar fasciitis. He has received two or three corticosteroid injections in the past, which provided relief. Currently, he uses custom insoles and compression socks to manage the condition. Recently, the pain has been aggravating, particularly on both sides of the heel, although it sometimes subsides. He describes the pain as 'kind of aggravating' and decided to have it evaluated during this visit.  He also has an issue with the right fourth toenail, which is peeling up. Despite keeping it trimmed, it does not appear to be improving. He suspects nail fungus, as the nail is changing color and becoming thick. He has previously used Lamisil cream for athlete's foot, which he experiences occasionally, particularly when the skin peels under his feet. He manages this by changing his socks frequently, especially when he notices early signs of athlete's foot. The skin peeling usually resolves within a week.  In terms of past medical history, he has had kidney stones and an allergic reaction following shoulder surgery, which caused urinary retention and bowel issues for a few days. He had lab work done last June or July, which showed normal liver and kidney function. He has been on cholesterol medications for about twenty years  without experiencing muscle pain or weakness. No chronic kidney disease.          Objective:    Physical Exam   CARDIOVASCULAR: Palpable pulses. EXTREMITIES: Feet warm, well-formed, protruding laterally. Pain on palpation of plantar medial heel at medial band of plantar fascia, some laterally and in plantar central heel. Right fourth toenail onychomycosis with 100% nail involvement. No detachment of nail plate.           Results   Procedure: Corticosteroid injection left heel Description: Following informed consent, the area was prepped with alcohol and a chloride spray was used as a topical anesthetic. Twenty mg of Kenalog, four mg of dexamethasone, and 1.0 cc of 0.5% Marcaine plain were injected into the plantar medial heel at the insertion of the medial band of the plantar fascia. The patient tolerated the procedure well, and the site was dressed with a Band-Aid.      Assessment:   1. Plantar fasciitis   2. Onychomycosis      Plan:  Patient was evaluated and treated and all questions answered.  Assessment and Plan    Plantar fasciitis with heel spur Chronic plantar fasciitis in the left foot with heel spur causing bilateral heel pain. Previous injections given. Heel spur contributes to pain but does not require surgery. - Administer corticosteroid injection to the left heel. - Provide exercises for home physical therapy to stretch and strengthen calf muscles. - Schedule follow-up visit in three months or sooner if  heel pain worsens.  Onychomycosis Onychomycosis of the right fourth toenail with complete nail involvement. Oral terbinafine recommended for better nail bed penetration. - Prescribe oral terbinafine (Lamisil) once daily for 90 days. - Monitor for side effects such as nausea, stomach cramps, and muscle pain. - Advise him to report any side effects and discontinue medication if he experiences them. - Schedule follow-up visit in three months to assess treatment  progress.          Return in about 15 weeks (around 08/07/2023) for follow up after nail fungus treatment, recheck plantar fasciitis.

## 2023-04-24 NOTE — Patient Instructions (Addendum)
 VISIT SUMMARY:  Today, we addressed your heel pain and toenail issues. You have been experiencing aggravating heel pain due to chronic plantar fasciitis, and you also have a peeling toenail that appears to be a fungal infection.  YOUR PLAN:  -PLANTAR FASCIITIS WITH HEEL SPUR: Plantar fasciitis is a condition where the tissue on the bottom of your foot becomes inflamed, often causing heel pain. A heel spur is a bony growth on the heel that can contribute to this pain. We administered a corticosteroid injection to your left heel to help reduce the inflammation and pain. Additionally, we provided you with exercises to stretch and strengthen your calf muscles. Please perform these exercises regularly. We will follow up in three months, or sooner if your heel pain worsens.  -ONYCHOMYCOSIS: Onychomycosis is a fungal infection of the toenail, which can cause the nail to become thick, discolored, and peel. We prescribed oral terbinafine (Lamisil) to be taken once daily for 90 days to treat the infection. Please monitor for any side effects such as nausea, stomach cramps, or muscle pain, and report them immediately. If you experience any side effects, discontinue the medication and contact us. We will assess your progress in three months.  INSTRUCTIONS:  Please schedule a follow-up visit in three months to assess the progress of your treatments. If your heel pain worsens or if you experience any side effects from the terbinafine, contact us immediately.  For more information, you can read your full clinical note, available in your patient portal.  Plantar Fasciitis (Heel Spur Syndrome) with Rehab The plantar fascia is a fibrous, ligament-like, soft-tissue structure that spans the bottom of the foot. Plantar fasciitis is a condition that causes pain in the foot due to inflammation of the tissue. SYMPTOMS  Pain and tenderness on the underneath side of the foot. Pain that worsens with standing or  walking. CAUSES  Plantar fasciitis is caused by irritation and injury to the plantar fascia on the underneath side of the foot. Common mechanisms of injury include: Direct trauma to bottom of the foot. Damage to a small nerve that runs under the foot where the main fascia attaches to the heel bone. Stress placed on the plantar fascia due to bone spurs. RISK INCREASES WITH:  Activities that place stress on the plantar fascia (running, jumping, pivoting, or cutting). Poor strength and flexibility. Improperly fitted shoes. Tight calf muscles. Flat feet. Failure to warm-up properly before activity. Obesity. PREVENTION Warm up and stretch properly before activity. Allow for adequate recovery between workouts. Maintain physical fitness: Strength, flexibility, and endurance. Cardiovascular fitness. Maintain a health body weight. Avoid stress on the plantar fascia. Wear properly fitted shoes, including arch supports for individuals who have flat feet.  PROGNOSIS  If treated properly, then the symptoms of plantar fasciitis usually resolve without surgery. However, occasionally surgery is necessary.  RELATED COMPLICATIONS  Recurrent symptoms that may result in a chronic condition. Problems of the lower back that are caused by compensating for the injury, such as limping. Pain or weakness of the foot during push-off following surgery. Chronic inflammation, scarring, and partial or complete fascia tear, occurring more often from repeated injections.  TREATMENT  Treatment initially involves the use of ice and medication to help reduce pain and inflammation. The use of strengthening and stretching exercises may help reduce pain with activity, especially stretches of the Achilles tendon. These exercises may be performed at home or with a therapist. Your caregiver may recommend that you use heel cups of arch supports  to help reduce stress on the plantar fascia. Occasionally, corticosteroid  injections are given to reduce inflammation. If symptoms persist for greater than 6 months despite non-surgical (conservative), then surgery may be recommended.   MEDICATION  If pain medication is necessary, then nonsteroidal anti-inflammatory medications, such as aspirin and ibuprofen, or other minor pain relievers, such as acetaminophen, are often recommended. Do not take pain medication within 7 days before surgery. Prescription pain relievers may be given if deemed necessary by your caregiver. Use only as directed and only as much as you need. Corticosteroid injections may be given by your caregiver. These injections should be reserved for the most serious cases, because they may only be given a certain number of times.  HEAT AND COLD Cold treatment (icing) relieves pain and reduces inflammation. Cold treatment should be applied for 10 to 15 minutes every 2 to 3 hours for inflammation and pain and immediately after any activity that aggravates your symptoms. Use ice packs or massage the area with a piece of ice (ice massage). Heat treatment may be used prior to performing the stretching and strengthening activities prescribed by your caregiver, physical therapist, or athletic trainer. Use a heat pack or soak the injury in warm water.  SEEK IMMEDIATE MEDICAL CARE IF: Treatment seems to offer no benefit, or the condition worsens. Any medications produce adverse side effects.  EXERCISES- RANGE OF MOTION (ROM) AND STRETCHING EXERCISES - Plantar Fasciitis (Heel Spur Syndrome) These exercises may help you when beginning to rehabilitate your injury. Your symptoms may resolve with or without further involvement from your physician, physical therapist or athletic trainer. While completing these exercises, remember:  Restoring tissue flexibility helps normal motion to return to the joints. This allows healthier, less painful movement and activity. An effective stretch should be held for at least 30  seconds. A stretch should never be painful. You should only feel a gentle lengthening or release in the stretched tissue.  RANGE OF MOTION - Toe Extension, Flexion Sit with your right / left leg crossed over your opposite knee. Grasp your toes and gently pull them back toward the top of your foot. You should feel a stretch on the bottom of your toes and/or foot. Hold this stretch for 10 seconds. Now, gently pull your toes toward the bottom of your foot. You should feel a stretch on the top of your toes and or foot. Hold this stretch for 10 seconds. Repeat  times. Complete this stretch 3 times per day.   RANGE OF MOTION - Ankle Dorsiflexion, Active Assisted Remove shoes and sit on a chair that is preferably not on a carpeted surface. Place right / left foot under knee. Extend your opposite leg for support. Keeping your heel down, slide your right / left foot back toward the chair until you feel a stretch at your ankle or calf. If you do not feel a stretch, slide your bottom forward to the edge of the chair, while still keeping your heel down. Hold this stretch for 10 seconds. Repeat 3 times. Complete this stretch 2 times per day.   STRETCH  Gastroc, Standing Place hands on wall. Extend right / left leg, keeping the front knee somewhat bent. Slightly point your toes inward on your back foot. Keeping your right / left heel on the floor and your knee straight, shift your weight toward the wall, not allowing your back to arch. You should feel a gentle stretch in the right / left calf. Hold this position for 10 seconds.  Repeat 3 times. Complete this stretch 2 times per day.  STRETCH  Soleus, Standing Place hands on wall. Extend right / left leg, keeping the other knee somewhat bent. Slightly point your toes inward on your back foot. Keep your right / left heel on the floor, bend your back knee, and slightly shift your weight over the back leg so that you feel a gentle stretch deep in your  back calf. Hold this position for 10 seconds. Repeat 3 times. Complete this stretch 2 times per day.  STRETCH  Gastrocsoleus, Standing  Note: This exercise can place a lot of stress on your foot and ankle. Please complete this exercise only if specifically instructed by your caregiver.  Place the ball of your right / left foot on a step, keeping your other foot firmly on the same step. Hold on to the wall or a rail for balance. Slowly lift your other foot, allowing your body weight to press your heel down over the edge of the step. You should feel a stretch in your right / left calf. Hold this position for 10 seconds. Repeat this exercise with a slight bend in your right / left knee. Repeat 3 times. Complete this stretch 2 times per day.   STRENGTHENING EXERCISES - Plantar Fasciitis (Heel Spur Syndrome)  These exercises may help you when beginning to rehabilitate your injury. They may resolve your symptoms with or without further involvement from your physician, physical therapist or athletic trainer. While completing these exercises, remember:  Muscles can gain both the endurance and the strength needed for everyday activities through controlled exercises. Complete these exercises as instructed by your physician, physical therapist or athletic trainer. Progress the resistance and repetitions only as guided.  STRENGTH - Towel Curls Sit in a chair positioned on a non-carpeted surface. Place your foot on a towel, keeping your heel on the floor. Pull the towel toward your heel by only curling your toes. Keep your heel on the floor. Repeat 3 times. Complete this exercise 2 times per day.  STRENGTH - Ankle Inversion Secure one end of a rubber exercise band/tubing to a fixed object (table, pole). Loop the other end around your foot just before your toes. Place your fists between your knees. This will focus your strengthening at your ankle. Slowly, pull your big toe up and in, making sure the  band/tubing is positioned to resist the entire motion. Hold this position for 10 seconds. Have your muscles resist the band/tubing as it slowly pulls your foot back to the starting position. Repeat 3 times. Complete this exercises 2 times per day.  Document Released: 02/05/2005 Document Revised: 04/30/2011 Document Reviewed: 05/20/2008 [facility] Patient Information 2014 [place], [place].

## 2023-05-14 ENCOUNTER — Encounter: Payer: Self-pay | Admitting: Podiatry

## 2023-07-29 ENCOUNTER — Ambulatory Visit (INDEPENDENT_AMBULATORY_CARE_PROVIDER_SITE_OTHER): Admitting: Podiatry

## 2023-07-29 ENCOUNTER — Encounter: Payer: Self-pay | Admitting: Podiatry

## 2023-07-29 DIAGNOSIS — M722 Plantar fascial fibromatosis: Secondary | ICD-10-CM

## 2023-07-29 DIAGNOSIS — M19072 Primary osteoarthritis, left ankle and foot: Secondary | ICD-10-CM | POA: Diagnosis not present

## 2023-07-29 DIAGNOSIS — B351 Tinea unguium: Secondary | ICD-10-CM | POA: Diagnosis not present

## 2023-07-29 NOTE — Progress Notes (Signed)
  Subjective:  Patient ID: Ethan Austin, male    DOB: 08-Dec-1955,  MRN: [id number]  Chief Complaint  Patient presents with   Plantar Fasciitis    "It's better."   Toe Pain    "The knuckle of my left big toe seems to be sore."   Nail Problem    "I think it's doing better."    68 y.o. male presents with the above complaint. History confirmed with patient.  He notes quite a bit of improvement in the toenail appearance after taking the Lamisil  has 1 pill left.  Plantar fasciitis has resolved after his injection, he is still doing the exercises.  Does not particular have much pain but feels stiffness and a numbness feeling in the bottom of the left big toe joint  Objective:  Physical Exam: warm, good capillary refill, no trophic changes or ulcerative lesions, normal DP and PT pulses, normal sensory exam, and right fourth toe 75% clearance of onychomycosis, no pain on the plantar fascia on the left foot today, he has limited range of motion of the interphalangeal joint and mild tenderness plantar medial none in the MTP joint   Radiographs: Multiple views x-ray of the left foot: Previously taken left foot radiographs at last visit shows mild degenerative changes in the medial portion of the interphalangeal joint of the left hallux Assessment:   1. Degenerative arthritis of toe joint, left   2. Onychomycosis   3. Plantar fasciitis      Plan:  Patient was evaluated and treated and all questions answered.  For his plantar fasciitis this is resolved after an injection and he should continue his home physical therapy plan until he is completely pain-free.  Onychomycosis significant improvement he will finish his last dose tomorrow and allow it to grow out advised to take a picture once per month to evaluate, if not improved or worsens by the end of summer then we can refill his Lamisil  if needed but expect this should be completely resolved with time.  I reviewed his old radiographs from  previous visit discussed that he does have some degenerative changes noted in the interphalangeal joint we discussed that if this worsen becomes painful and treating with oral medication such as Tylenol  arthritis and then if this is not successful corticosteroid injection could offer relief.  Long-term interphalangeal joint fusion can eliminate this as well but do not expect this will be necessary for him.  Return if symptoms worsen or fail to improve.
